# Patient Record
Sex: Female | Born: 1955
Health system: Southern US, Community
[De-identification: ages and names within clinical notes are randomized; demographics above are authoritative.]

## PROBLEM LIST (undated history)

## (undated) DIAGNOSIS — E119 Type 2 diabetes mellitus without complications: Secondary | ICD-10-CM

## (undated) DIAGNOSIS — K6819 Other retroperitoneal abscess: Secondary | ICD-10-CM

## (undated) DIAGNOSIS — D219 Benign neoplasm of connective and other soft tissue, unspecified: Secondary | ICD-10-CM

## (undated) HISTORY — PX: TUBAL LIGATION: SHX77

## (undated) HISTORY — PX: CYST EXCISION: SHX5701

## (undated) HISTORY — DX: Other retroperitoneal abscess: K68.19

## (undated) HISTORY — PX: BREAST BIOPSY: SHX20

## (undated) HISTORY — DX: Benign neoplasm of connective and other soft tissue, unspecified: D21.9

---

## 2005-12-09 ENCOUNTER — Emergency Department (HOSPITAL_COMMUNITY): Admission: EM | Admit: 2005-12-09 | Discharge: 2005-12-09 | Payer: Self-pay | Admitting: Emergency Medicine

## 2005-12-25 ENCOUNTER — Encounter: Admission: RE | Admit: 2005-12-25 | Discharge: 2005-12-25 | Payer: Self-pay | Admitting: General Practice

## 2005-12-29 ENCOUNTER — Encounter: Admission: RE | Admit: 2005-12-29 | Discharge: 2006-02-27 | Payer: Self-pay | Admitting: General Practice

## 2006-05-22 ENCOUNTER — Other Ambulatory Visit: Admission: RE | Admit: 2006-05-22 | Discharge: 2006-05-22 | Payer: Self-pay | Admitting: Obstetrics and Gynecology

## 2006-06-17 ENCOUNTER — Encounter: Admission: RE | Admit: 2006-06-17 | Discharge: 2006-06-17 | Payer: Self-pay | Admitting: Obstetrics and Gynecology

## 2007-07-15 ENCOUNTER — Encounter: Admission: RE | Admit: 2007-07-15 | Discharge: 2007-07-15 | Payer: Self-pay | Admitting: Obstetrics and Gynecology

## 2007-08-05 ENCOUNTER — Encounter: Admission: RE | Admit: 2007-08-05 | Discharge: 2007-08-05 | Payer: Self-pay | Admitting: General Practice

## 2008-07-17 ENCOUNTER — Encounter: Admission: RE | Admit: 2008-07-17 | Discharge: 2008-07-17 | Payer: Self-pay | Admitting: Obstetrics and Gynecology

## 2008-12-26 ENCOUNTER — Encounter: Admission: RE | Admit: 2008-12-26 | Discharge: 2009-01-26 | Payer: Self-pay | Admitting: Orthopedic Surgery

## 2009-07-24 ENCOUNTER — Encounter: Admission: RE | Admit: 2009-07-24 | Discharge: 2009-07-24 | Payer: Self-pay | Admitting: Obstetrics and Gynecology

## 2009-08-01 ENCOUNTER — Encounter: Admission: RE | Admit: 2009-08-01 | Discharge: 2009-08-01 | Payer: Self-pay | Admitting: Obstetrics and Gynecology

## 2010-05-10 ENCOUNTER — Encounter: Admission: RE | Admit: 2010-05-10 | Discharge: 2010-05-10 | Payer: Self-pay | Admitting: Obstetrics and Gynecology

## 2010-06-06 ENCOUNTER — Emergency Department (HOSPITAL_COMMUNITY): Admission: EM | Admit: 2010-06-06 | Discharge: 2010-06-06 | Payer: Self-pay | Admitting: Emergency Medicine

## 2010-09-03 ENCOUNTER — Encounter: Admission: RE | Admit: 2010-09-03 | Discharge: 2010-09-03 | Payer: Self-pay | Admitting: General Practice

## 2010-09-24 ENCOUNTER — Encounter
Admission: RE | Admit: 2010-09-24 | Discharge: 2010-10-31 | Payer: Self-pay | Source: Home / Self Care | Admitting: Unknown Physician Specialty

## 2011-06-20 ENCOUNTER — Other Ambulatory Visit: Payer: Self-pay | Admitting: Obstetrics and Gynecology

## 2011-06-20 DIAGNOSIS — Z1231 Encounter for screening mammogram for malignant neoplasm of breast: Secondary | ICD-10-CM

## 2011-06-24 ENCOUNTER — Ambulatory Visit
Admission: RE | Admit: 2011-06-24 | Discharge: 2011-06-24 | Disposition: A | Discharge status: Home / Self Care | Source: Ambulatory Visit | Attending: Obstetrics and Gynecology | Admitting: Obstetrics and Gynecology

## 2011-06-24 DIAGNOSIS — Z1231 Encounter for screening mammogram for malignant neoplasm of breast: Secondary | ICD-10-CM

## 2012-07-26 ENCOUNTER — Other Ambulatory Visit: Payer: Self-pay | Admitting: Obstetrics and Gynecology

## 2012-07-26 DIAGNOSIS — Z1231 Encounter for screening mammogram for malignant neoplasm of breast: Secondary | ICD-10-CM

## 2012-08-03 ENCOUNTER — Ambulatory Visit

## 2012-08-09 ENCOUNTER — Ambulatory Visit

## 2012-10-22 ENCOUNTER — Ambulatory Visit
Admission: RE | Admit: 2012-10-22 | Discharge: 2012-10-22 | Disposition: A | Discharge status: Home / Self Care | Source: Ambulatory Visit | Attending: Obstetrics and Gynecology | Admitting: Obstetrics and Gynecology

## 2012-10-22 DIAGNOSIS — Z1231 Encounter for screening mammogram for malignant neoplasm of breast: Secondary | ICD-10-CM

## 2012-11-03 ENCOUNTER — Ambulatory Visit (INDEPENDENT_AMBULATORY_CARE_PROVIDER_SITE_OTHER): Payer: Medicaid Other | Admitting: Obstetrics and Gynecology

## 2012-11-03 ENCOUNTER — Encounter: Payer: Self-pay | Admitting: Obstetrics and Gynecology

## 2012-11-03 VITALS — BP 102/70 | HR 68 | Ht 64.0 in | Wt 143.0 lb

## 2012-11-03 DIAGNOSIS — Z124 Encounter for screening for malignant neoplasm of cervix: Secondary | ICD-10-CM

## 2012-11-03 DIAGNOSIS — Z01419 Encounter for gynecological examination (general) (routine) without abnormal findings: Secondary | ICD-10-CM

## 2012-11-03 NOTE — Patient Instructions (Addendum)
Consider the following to regulate bowel movements:  Stool Softeners (Docusate Sodium) 100 mg 3-4 daily until bowel movements are regular, decrease until desired frequency is reached OR Benefiber as directed daily if able to drink liquids regularly throughout the day OR Miralax as directed for daily use along with Probiotics daily   Amitiza 8 mcg twice daily with food and 8 ounces of water

## 2012-11-03 NOTE — Progress Notes (Addendum)
Subjective:    Emma Hampton is a 57 y.o. female G3P0 who presents for annual exam. The patient has no complaints today except for problems with constipation which is chronic.  Stool is not particularly  hard and goes twice a day or may skip days,  but doesn't feel like she totally emptying her rectum.   Admits to gas and trapping of the same with discomfort.  Symptoms have worsened over the past several months.   Review of Systems Gastrointestinal:No change in bowel habits, no abdominal pain, no rectal bleeding Genitourinary:negative for abnormal vaginal bleeding,  dysuria, frequency, hematuria, nocturia and urinary incontinence   Objective:     BP 102/70  Pulse 68  Ht 5\' 4"  (1.626 m)  Wt 143 lb (64.864 kg)  BMI 24.55 kg/m2 Weight:  Wt Readings from Last 1 Encounters:  11/03/12 143 lb (64.864 kg)   Body mass index is 24.55 kg/(m^2). General Appearance:  Well nourished in no acute distress HEENT: Grossly normal Neck / Thyroid: Supple, no masses, nodes or enlargement Lungs: Clear to auscultation bilaterally Back: No CVA tenderness Breast Exam: No masses or nodes.No dimpling, nipple retraction or discharge. Cardiovascular: Regular rate and rhythm.  Gastrointestinal: Soft, non-tender, no masses or organomegaly Pelvic Exam: EGBUS-normal, vagina-atrophic mucosa without lesions, cervix-stenotic and flush with vagina, no tenderness or lesions, uterus-appears normal size, shape and consistency, adnexae-no masses or tenderness Rectovaginal: Normal sphincter tone and  no masses  Lymphatic Exam: Non-palpable nodes in neck, clavicular, axillary, or inguinal regions Skin: No rash or abnormalities Extremities: no clubbing cyanosis or edema Neurologic: Grossly normal Psychiatric: Alert and oriented x 3   Assessment:   Routine GYN Exam Constipation (chronic vs IBS)   Plan:  Bowel Hygiene  Trial of Amitiza 8 mcg #12 bid pc with 8 ounces of water-patient to let office know  if she  wants a prescription for this  PAP sent  RTO 1 year or prn  Kaitlyn Franko,ELMIRAPA-C

## 2012-11-03 NOTE — Progress Notes (Signed)
Regular Periods: no Mammogram: yes  Monthly Breast Ex.: yes Exercise: yes  Tetanus < 10 years: yes Seatbelts: yes  NI. Bladder Functn.: yes Abuse at home: no  Daily BM's: yes Stressful Work: no  Healthy Diet: yes Sigmoid-Colonoscopy: AGE 56  Calcium: no Medical problems this year: PT WANT SOMETHING FOR CONSTIPATION   LAST PAP:8/12  Contraception: NONE POST MENOPAUSEL  Mammogram:  2 WEEKS AGO   NL  PCP: DR. Thurmond Butts  PMH: NO CHANGE  FMH: NO CHANGE  Last Bone Scan: NO   PT IS WIDOW.

## 2012-11-04 LAB — PAP IG W/ RFLX HPV ASCU

## 2013-09-09 ENCOUNTER — Encounter (HOSPITAL_BASED_OUTPATIENT_CLINIC_OR_DEPARTMENT_OTHER): Payer: Self-pay | Admitting: *Deleted

## 2013-09-09 ENCOUNTER — Observation Stay (HOSPITAL_BASED_OUTPATIENT_CLINIC_OR_DEPARTMENT_OTHER)
Admission: EM | Admit: 2013-09-09 | Discharge: 2013-09-11 | Disposition: A | Discharge status: Home / Self Care | Attending: Family Medicine | Admitting: Family Medicine

## 2013-09-09 ENCOUNTER — Emergency Department (HOSPITAL_BASED_OUTPATIENT_CLINIC_OR_DEPARTMENT_OTHER)

## 2013-09-09 DIAGNOSIS — R509 Fever, unspecified: Principal | ICD-10-CM | POA: Insufficient documentation

## 2013-09-09 DIAGNOSIS — G039 Meningitis, unspecified: Secondary | ICD-10-CM

## 2013-09-09 DIAGNOSIS — A879 Viral meningitis, unspecified: Secondary | ICD-10-CM

## 2013-09-09 LAB — CSF CELL COUNT WITH DIFFERENTIAL
Other Cells, CSF: 0
Segmented Neutrophils-CSF: 0 % (ref 0–6)
WBC, CSF: 120 /mm3 (ref 0–5)

## 2013-09-09 LAB — CBC WITH DIFFERENTIAL/PLATELET
Basophils Absolute: 0 10*3/uL (ref 0.0–0.1)
Eosinophils Relative: 0 % (ref 0–5)
Hemoglobin: 14.2 g/dL (ref 12.0–15.0)
MCHC: 33.6 g/dL (ref 30.0–36.0)
MCV: 93.4 fL (ref 78.0–100.0)
Monocytes Absolute: 0.7 10*3/uL (ref 0.1–1.0)
Monocytes Relative: 10 % (ref 3–12)
Neutro Abs: 4.8 10*3/uL (ref 1.7–7.7)
Platelets: 234 10*3/uL (ref 150–400)
RBC: 4.52 MIL/uL (ref 3.87–5.11)
RDW: 13.7 % (ref 11.5–15.5)

## 2013-09-09 LAB — BASIC METABOLIC PANEL
BUN: 12 mg/dL (ref 6–23)
Creatinine, Ser: 0.8 mg/dL (ref 0.50–1.10)
Potassium: 3.6 mEq/L (ref 3.5–5.1)
Sodium: 137 mEq/L (ref 135–145)

## 2013-09-09 LAB — PROTEIN, CSF: Total  Protein, CSF: 113 mg/dL — ABNORMAL HIGH (ref 15–45)

## 2013-09-09 MED ORDER — METOCLOPRAMIDE HCL 5 MG/ML IJ SOLN
10.0000 mg | Freq: Once | INTRAMUSCULAR | Status: AC
  Administered 2013-09-09: 10 mg via INTRAVENOUS
  Filled 2013-09-09: qty 2

## 2013-09-09 MED ORDER — AMPICILLIN SODIUM 2 G IJ SOLR: 2.0000 g | Freq: Once | INTRAMUSCULAR | Status: DC

## 2013-09-09 MED ORDER — ONDANSETRON HCL 4 MG/2ML IJ SOLN
4.0000 mg | Freq: Once | INTRAMUSCULAR | Status: AC
  Administered 2013-09-09: 4 mg via INTRAVENOUS
  Filled 2013-09-09: qty 2

## 2013-09-09 MED ORDER — SODIUM CHLORIDE 0.9 % IV BOLUS (SEPSIS)
1000.0000 mL | Freq: Once | INTRAVENOUS | Status: AC
  Administered 2013-09-09: 1000 mL via INTRAVENOUS

## 2013-09-09 MED ORDER — VANCOMYCIN HCL IN DEXTROSE 1-5 GM/200ML-% IV SOLN
1000.0000 mg | Freq: Once | INTRAVENOUS | Status: AC
  Administered 2013-09-09: 1000 mg via INTRAVENOUS
  Filled 2013-09-09: qty 200

## 2013-09-09 MED ORDER — SODIUM CHLORIDE 0.9 % IV SOLN
INTRAVENOUS | Status: AC
  Administered 2013-09-09: 21:00:00 via INTRAVENOUS

## 2013-09-09 MED ORDER — DEXTROSE 5 % IV SOLN
2.0000 g | Freq: Once | INTRAVENOUS | Status: AC
  Administered 2013-09-09: 2 g via INTRAVENOUS
  Filled 2013-09-09: qty 2

## 2013-09-09 MED ORDER — DEXAMETHASONE SODIUM PHOSPHATE 10 MG/ML IJ SOLN
10.0000 mg | Freq: Once | INTRAMUSCULAR | Status: AC
  Administered 2013-09-09: 10 mg via INTRAVENOUS
  Filled 2013-09-09: qty 1

## 2013-09-09 MED ORDER — MORPHINE SULFATE 4 MG/ML IJ SOLN
4.0000 mg | Freq: Once | INTRAMUSCULAR | Status: AC
  Administered 2013-09-09: 4 mg via INTRAVENOUS
  Filled 2013-09-09: qty 1

## 2013-09-09 MED ORDER — DIPHENHYDRAMINE HCL 50 MG/ML IJ SOLN
25.0000 mg | Freq: Once | INTRAMUSCULAR | Status: AC
  Administered 2013-09-09: 25 mg via INTRAVENOUS
  Filled 2013-09-09: qty 1

## 2013-09-09 NOTE — ED Notes (Signed)
Report to Shane, RN

## 2013-09-09 NOTE — ED Provider Notes (Signed)
CSN: [id number]     Arrival date & time 09/09/13  1606 History   First MD Initiated Contact with Patient 09/09/13 1615     Chief Complaint  Patient presents with  . Headache   (Consider location/radiation/quality/duration/timing/severity/associated sxs/prior Treatment) HPI Comments: Patient comes to the ER for evaluation of headache. Patient reports a severe right-sided headache for 3 days. The patient reports that the headache came on somewhat suddenly 3 days ago and has been persistent. She says that she noticed pain in her right ear prior to onset of a headache, but the urinary cath resolved. She has not had any nausea, vomiting, diarrhea. She denies neck pain and stiffness, but has had a low-grade temperature of 100.6.  Patient is a 57 y.o. female presenting with headaches.  Headache Associated symptoms: fever   Associated symptoms: no neck pain and no neck stiffness     Past Medical History  Diagnosis Date  . Fibroids   . Retroperitoneal abscess    Past Surgical History  Procedure Laterality Date  . Cesarean section    . Cyst excision    . Tubal ligation     Family History  Problem Relation Age of Onset  . Heart disease Mother     MI  . Cancer Daughter     BREAST   LEFT MASECT  . Breast cancer Daughter   . Diabetes Maternal Uncle   . Cancer Cousin     IN LEGS   History  Substance Use Topics  . Smoking status: Never Smoker   . Smokeless tobacco: Never Used  . Alcohol Use: 0.6 oz/week    1 Glasses of wine per week   OB History   Grav Para Term Preterm Abortions TAB SAB Ect Mult Living   3         2     Review of Systems  Constitutional: Positive for fever.  HENT: Negative for neck pain and neck stiffness.   Neurological: Positive for headaches.  All other systems reviewed and are negative.    Allergies  Codeine  Home Medications   Current Outpatient Rx  Name  Route  Sig  Dispense  Refill  . Multiple Vitamin (MULTIVITAMIN) tablet   Oral   Take 1  tablet by mouth daily.         . Nutritional Supplements (ESTROVEN PO)   Oral   Take by mouth.          BP 136/82  Pulse 73  Temp(Src) 98.4 F (36.9 C) (Oral)  Resp 16  Ht 5\' 4"  (1.626 m)  Wt 137 lb (62.143 kg)  BMI 23.5 kg/m2  SpO2 100% Physical Exam  Constitutional: She is oriented to person, place, and time. She appears well-developed and well-nourished. No distress.  HENT:  Head: Normocephalic and atraumatic.  Right Ear: Hearing, tympanic membrane and ear canal normal.  Left Ear: Hearing normal.  Nose: Nose normal.  Mouth/Throat: Oropharynx is clear and moist and mucous membranes are normal.  Eyes: Conjunctivae and EOM are normal. Pupils are equal, round, and reactive to light.  Neck: Normal range of motion. Neck supple.  Cardiovascular: Regular rhythm, S1 normal and S2 normal.  Exam reveals no gallop and no friction rub.   No murmur heard. Pulmonary/Chest: Effort normal and breath sounds normal. No respiratory distress. She exhibits no tenderness.  Abdominal: Soft. Normal appearance and bowel sounds are normal. There is no hepatosplenomegaly. There is no tenderness. There is no rebound, no guarding, no tenderness at McBurney's point  and negative Murphy's sign. No hernia.  Musculoskeletal: Normal range of motion.  Neurological: She is alert and oriented to person, place, and time. She has normal strength. No cranial nerve deficit or sensory deficit. Coordination normal. GCS eye subscore is 4. GCS verbal subscore is 5. GCS motor subscore is 6.  Skin: Skin is warm, dry and intact. No rash noted. No cyanosis.  Psychiatric: She has a normal mood and affect. Her speech is normal and behavior is normal. Thought content normal.    ED Course  LUMBAR PUNCTURE Date/Time: 09/09/2013 6:59 PM Performed by: Gilda Crease. Authorized by: Gilda Crease Consent: Verbal consent obtained. written consent obtained. Risks and benefits: risks, benefits and alternatives  were discussed Consent given by: patient Patient understanding: patient states understanding of the procedure being performed Patient consent: the patient's understanding of the procedure matches consent given Procedure consent: procedure consent matches procedure scheduled Relevant documents: relevant documents present and verified Test results: test results available and properly labeled Site marked: the operative site was marked Imaging studies: imaging studies available Patient identity confirmed: arm band and verbally with patient Time out: Immediately prior to procedure a "time out" was called to verify the correct patient, procedure, equipment, support staff and site/side marked as required. Indications: evaluation for infection and evaluation for subarachnoid hemorrhage Anesthesia: local infiltration Local anesthetic: lidocaine 1% without epinephrine Patient sedated: no Preparation: Patient was prepped and draped in the usual sterile fashion. Lumbar space: L3-L4 interspace Patient's position: sitting Needle gauge: 20 Needle type: spinal needle - Quincke tip Needle length: 3.5 in Number of attempts: 2 Opening pressure: 30.5 cm H2O Fluid appearance: clear Tubes of fluid: 4 Total volume: 4 ml Post-procedure: site cleaned and adhesive bandage applied Patient tolerance: Patient tolerated the procedure well with no immediate complications.   (including critical care time) Labs Review Labs Reviewed  BASIC METABOLIC PANEL - Abnormal; Notable for the following:    GFR calc non Af Amer 79 (*)    All other components within normal limits  PROTEIN, CSF - Abnormal; Notable for the following:    Total  Protein, CSF 113 (*)    All other components within normal limits  CSF CULTURE  CBC WITH DIFFERENTIAL  GLUCOSE, CSF  CSF CELL COUNT WITH DIFFERENTIAL  CSF CELL COUNT WITH DIFFERENTIAL   Imaging Review Ct Head Wo Contrast  09/09/2013   CLINICAL DATA:  Severe headache  EXAM: CT  HEAD WITHOUT CONTRAST  TECHNIQUE: Contiguous axial images were obtained from the base of the skull through the vertex without intravenous contrast.  COMPARISON:  06/06/2010  FINDINGS: No midline shift, ventriculomegaly, mass effect, evidence of mass lesion, intracranial hemorrhage or evidence of cortically based acute infarction. Gray-white matter differentiation is within normal limits throughout the brain. The paranasal sinuses and mastoid air cells are clear. The skull is intact.  IMPRESSION: Normal brain.   Electronically Signed   By: Signa Kell M.D.   On: 09/09/2013 17:20    MDM  Diagnosis: 1. Headache 2. Fever 3. Rule out meningitis  Patient presents to the ER for evaluation of severe headache that started 3 days ago and has been continuous. Patient reports that she does not usually have headaches like this. She has unremarkable neurologic findings on examination.  Based on the fact that the headache is unusual for her, recommended CT. Additionally, patient describes fever at home, which raises concern for meningitis. He recommended pursuing a lumbar puncture as well. After discussing these procedures, patient did consent to  the treatment plan.  Patient's blood work was unremarkable as was the CT. Lumbar puncture was performed and although she has normal glucose, has elevated protein. This is likely a viral meningitis type picture, but Gram stain and cell count and differential not available yet. Patient initiated on empiric antibiotic coverage and will be admitted for further management.    Gilda Crease, MD 09/09/13 2025

## 2013-09-09 NOTE — Progress Notes (Signed)
PENDING ACCEPTANCE TRANFER NOTE:  Call received from: Jaci Carrel of [facility].  REASON FOR REQUESTING TRANSFER: ?viral meningitis.  HPI: 57 yo with severe HA, photophobia, but no stiff neck and no confusion, low grad fever of 100, for the past 3 days. LP showed glucose normal glucose and Protein of 113, head CT negative.  Cell count and gramstain are pending. She was started on Amp/Van/Rocephin and he would like her transferred here for follow up LP result and further treatment.   PLAN:  According to telephone report, this patient was accepted for transfer to general medical floor,   Under [facility] team:  10,  I have requested an order be written to call Flow Manager at [phone] upon patient arrival to the floor for final physician assignment who will do the admission and give admitting orders.  SIGNEDHouston Siren, MD [facility]  09/09/2013, 9:01 PM

## 2013-09-09 NOTE — ED Notes (Signed)
Pt c/o " severe h/a " x 3 days

## 2013-09-10 ENCOUNTER — Encounter (HOSPITAL_COMMUNITY): Payer: Self-pay

## 2013-09-10 DIAGNOSIS — A879 Viral meningitis, unspecified: Secondary | ICD-10-CM

## 2013-09-10 MED ORDER — ONDANSETRON HCL 4 MG/2ML IJ SOLN
4.0000 mg | Freq: Four times a day (QID) | INTRAMUSCULAR | Status: DC | PRN
  Administered 2013-09-10: 4 mg via INTRAVENOUS
  Filled 2013-09-10: qty 2

## 2013-09-10 MED ORDER — DEXTROSE-NACL 5-0.9 % IV SOLN
INTRAVENOUS | Status: DC
  Administered 2013-09-10 (×2): via INTRAVENOUS

## 2013-09-10 MED ORDER — ACETAMINOPHEN 325 MG PO TABS
650.0000 mg | ORAL_TABLET | Freq: Four times a day (QID) | ORAL | Status: DC | PRN
  Administered 2013-09-10 – 2013-09-11 (×2): 650 mg via ORAL
  Filled 2013-09-10 (×2): qty 2

## 2013-09-10 MED ORDER — INFLUENZA VAC SPLIT QUAD 0.5 ML IM SUSP
0.5000 mL | INTRAMUSCULAR | Status: AC
  Administered 2013-09-11: 0.5 mL via INTRAMUSCULAR
  Filled 2013-09-10: qty 0.5

## 2013-09-10 MED ORDER — ONDANSETRON HCL 4 MG PO TABS: 4.0000 mg | ORAL_TABLET | Freq: Four times a day (QID) | ORAL | Status: DC | PRN

## 2013-09-10 MED ORDER — HYDROMORPHONE HCL PF 1 MG/ML IJ SOLN: 0.5000 mg | INTRAMUSCULAR | Status: DC | PRN

## 2013-09-10 MED ORDER — HYDROMORPHONE HCL PF 1 MG/ML IJ SOLN
1.0000 mg | INTRAMUSCULAR | Status: DC | PRN
  Administered 2013-09-10: 1 mg via INTRAVENOUS
  Filled 2013-09-10 (×2): qty 1

## 2013-09-10 MED ORDER — DOCUSATE SODIUM 100 MG PO CAPS
100.0000 mg | ORAL_CAPSULE | Freq: Two times a day (BID) | ORAL | Status: DC
  Administered 2013-09-10 – 2013-09-11 (×3): 100 mg via ORAL
  Filled 2013-09-10 (×2): qty 1

## 2013-09-10 NOTE — Progress Notes (Signed)
Pt was seen and examined.  Still having headache and some back pain from LP.  Currently afebrile.   Cultures no growth.  Likely DC soon if remains stable.  Maryln Manuel, MD

## 2013-09-10 NOTE — H&P (Signed)
[facility] History and Physical  Hendrix Console AVW:[id number] DOB: Oct 14, 1956    PCP:   Erlinda Hong, MD   Chief Complaint: Headache for three days.  HPI: Emma Hampton is an 57 y.o. female with benign PMH on no chronic meds presents to [facility] with HA.  She has severe HA for three days with mild photophobia.  She denied any stiff neck, blurry Vx, but had low grade fever.  She has had no distant travel or any ill contacts.  Evalation in the ER showed negative head CT.  LP showed normal glucose, but elevated protein to 113.  Her LP showed 120 WBC and 180 RBC with 97 percent lymphocytic and 3 percent monocytic.  Gram stain showed no organism.  She was given IV Van/ Decadron/Rocephin/ Ampicillin IV at [facility], and was transferred here.  She is not confused at all, and her HA is controlled at this point.  Rewiew of Systems:  Constitutional: Negative for malaise, fever and chills. No significant weight loss or weight gain Eyes: Negative for eye pain, redness and discharge, diplopia, visual changes, or flashes of light. ENMT: Negative for ear pain, hoarseness, nasal congestion, sinus pressure and sore throat. No headaches; tinnitus, drooling, or problem swallowing. Cardiovascular: Negative for chest pain, palpitations, diaphoresis, dyspnea and peripheral edema. ; No orthopnea, PND Respiratory: Negative for cough, hemoptysis, wheezing and stridor. No pleuritic chestpain. Gastrointestinal: Negative for nausea, vomiting, diarrhea, constipation, abdominal pain, melena, blood in stool, hematemesis, jaundice and rectal bleeding.    Genitourinary: Negative for frequency, dysuria, incontinence,flank pain and hematuria; Musculoskeletal: Negative for back pain and neck pain. Negative for swelling and trauma.;  Skin: . Negative for pruritus, rash, abrasions, bruising and skin lesion.; ulcerations Neuro: Negative for lightheadedness and neck stiffness. Negative for weakness, altered level of  consciousness , altered mental status, extremity weakness, burning feet, involuntary movement, seizure and syncope.  Psych: negative for anxiety, depression, insomnia, tearfulness, panic attacks, hallucinations, paranoia, suicidal or homicidal ideation    Past Medical History  Diagnosis Date  . Fibroids   . Retroperitoneal abscess     Past Surgical History  Procedure Laterality Date  . Cesarean section    . Cyst excision    . Tubal ligation      Medications:  HOME MEDS: Prior to Admission medications   Medication Sig Start Date End Date Taking? Authorizing Provider  Multiple Vitamin (MULTIVITAMIN) tablet Take 1 tablet by mouth daily.    Historical Provider, MD  Nutritional Supplements (ESTROVEN PO) Take by mouth.    Historical Provider, MD     Allergies:  Allergies  Allergen Reactions  . Codeine     Social History:   reports that she has never smoked. She has never used smokeless tobacco. She reports that she drinks about 0.6 ounces of alcohol per week. She reports that she does not use illicit drugs.  Family History: Family History  Problem Relation Age of Onset  . Heart disease Mother     MI  . Cancer Daughter     BREAST   LEFT MASECT  . Breast cancer Daughter   . Diabetes Maternal Uncle   . Cancer Cousin     IN LEGS     Physical Exam: Filed Vitals:   09/09/13 1614 09/09/13 1740 09/09/13 2329 09/10/13 0107  BP:  148/72 127/74 154/83  Pulse: 73 72 62 67  Temp:  99.3 F (37.4 C) 98.1 F (36.7 C) 98.6 F (37 C)  TempSrc:  Oral Oral Oral  Resp:  16 17  Height:      Weight:    63.05 kg (139 lb)  SpO2: 100% 99% 99% 100%   Blood pressure 154/83, pulse 67, temperature 98.6 F (37 C), temperature source Oral, resp. rate 17, height 5\' 4"  (1.626 m), weight 63.05 kg (139 lb), SpO2 100.00%.  GEN:  Pleasant  patient lying in the stretcher in no acute distress; cooperative with exam. PSYCH:  alert and oriented x4; does not appear anxious or depressed; affect  is appropriate. HEENT: Mucous membranes pink and anicteric; PERRLA; EOM intact; no cervical lymphadenopathy nor thyromegaly or carotid bruit; no JVD; There were no stridor. Neck is very supple. Breasts:: Not examined CHEST WALL: No tenderness CHEST: Normal respiration, clear to auscultation bilaterally.  HEART: Regular rate and rhythm.  There are no murmur, rub, or gallops.   BACK: No kyphosis or scoliosis; no CVA tenderness ABDOMEN: soft and non-tender; no masses, no organomegaly, normal abdominal bowel sounds; no pannus; no intertriginous candida. There is no rebound and no distention. Rectal Exam: Not done EXTREMITIES: No bone or joint deformity; age-appropriate arthropathy of the hands and knees; no edema; no ulcerations.  There is no calf tenderness. Genitalia: not examined PULSES: 2+ and symmetric SKIN: Normal hydration no rash or ulceration CNS: Cranial nerves 2-12 grossly intact no focal lateralizing neurologic deficit.  Speech is fluent; uvula elevated with phonation, facial symmetry and tongue midline. DTR are normal bilaterally, cerebella exam is intact, barbinski is negative and strengths are equaled bilaterally.  No sensory loss.   Labs on Admission:  Basic Metabolic Panel:  Recent Labs Lab 09/09/13 1645  NA 137  K 3.6  CL 97  CO2 28  GLUCOSE 96  BUN 12  CREATININE 0.80  CALCIUM 10.3   Liver Function Tests: No results found for this basename: AST, ALT, ALKPHOS, BILITOT, PROT, ALBUMIN,  in the last 168 hours No results found for this basename: LIPASE, AMYLASE,  in the last 168 hours No results found for this basename: AMMONIA,  in the last 168 hours CBC:  Recent Labs Lab 09/09/13 1645  WBC 7.2  NEUTROABS 4.8  HGB 14.2  HCT 42.2  MCV 93.4  PLT 234   Cardiac Enzymes: No results found for this basename: CKTOTAL, CKMB, CKMBINDEX, TROPONINI,  in the last 168 hours  CBG: No results found for this basename: GLUCAP,  in the last 168 hours   Radiological Exams  on Admission: Ct Head Wo Contrast  09/09/2013   CLINICAL DATA:  Severe headache  EXAM: CT HEAD WITHOUT CONTRAST  TECHNIQUE: Contiguous axial images were obtained from the base of the skull through the vertex without intravenous contrast.  COMPARISON:  06/06/2010  FINDINGS: No midline shift, ventriculomegaly, mass effect, evidence of mass lesion, intracranial hemorrhage or evidence of cortically based acute infarction. Gray-white matter differentiation is within normal limits throughout the brain. The paranasal sinuses and mastoid air cells are clear. The skull is intact.  IMPRESSION: Normal brain.   Electronically Signed   By: Signa Kell M.D.   On: 09/09/2013 17:20    Assessment/Plan Viral meningitis.  PLAN: Her LP and clinical picture is most consistent with viral meningitis.  Will d/c all her antibiotics.  Will treat her symptomatically.  She will get IVF and stay flat to avoid post LP HA.  She is stable and will likely be discharged later today.  No need for droplet precaution at this time.  Thank you for asking me to admit this nice patient.  Other plans as  per orders.  Code Status: FULL Unk Lightning, MD. [facility] Pager [phone] 7pm to 7am.  09/10/2013, 2:10 AM

## 2013-09-11 DIAGNOSIS — A879 Viral meningitis, unspecified: Secondary | ICD-10-CM

## 2013-09-11 NOTE — Progress Notes (Signed)
Utilization Review completed.  

## 2013-09-11 NOTE — Discharge Summary (Signed)
Physician Discharge Summary  Emma Hampton ZOX:[id number] DOB: Dec 10, 1956 DOA: 09/09/2013  PCP: Erlinda Hong, MD  Admit date: 09/09/2013 Discharge date: 09/11/2013  Recommendations for Outpatient Follow-up:  1. Follow up on final CSF and blood culture results 2. Check blood pressure   Discharge Diagnoses:  Principal Problem:   Meningitis, viral  Discharge Condition: stable    Diet recommendation: regular  Filed Weights   09/09/13 1610 09/10/13 0107  Weight: 62.143 kg (137 lb) 63.05 kg (139 lb)    History of present illness:  Emma Hampton is an 57 y.o. female with benign PMH on no chronic meds presents to [facility] with HA. She has severe HA for three days with mild photophobia. She denied any stiff neck, no vision changes, but had low grade fever. She has had no distant travel or any ill contacts. Evalation in the ER showed negative head CT. LP showed normal glucose, but elevated protein to 113. Her LP showed 120 WBC and 180 RBC with 97 percent lymphocytic and 3 percent monocytic. Gram stain showed no organism. She was given IV Van/ Decadron/Rocephin/ Ampicillin IV at [facility], and was transferred here. She is not confused at all, and her HA is controlled at this point.  Hospital Course:  Viral meningitis.   Her LP and clinical picture is most consistent with viral meningitis. d/c all her antibiotics. Treat her symptomatically. She will get IVF and stay flat to avoid post LP HA. She remained stable and  discharged home to follow up with PCP.  CSF and blood cultures have been negative to date.   No need for droplet precaution.  Procedures:  Lumbar Puncture  Discharge Exam: Pt reports that she feels much better.  She is no longer having headache or back pain.  She has remained afebrile.  She is ambulating well.  No complaints and asking to go home.  Filed Vitals:   09/11/13 0907  BP: 116/58  Pulse: 52  Temp: 98.2 F (36.8 C)  Resp: 20   General: awake, alert, no  apparent distress Cardiovascular: normal s1, s2 sounds  Respiratory: BBS clear to ausculation Ext- no CCE Neuro- nonfocal   Discharge Instructions  Discharge Orders   Future Orders Complete By Expires   Call MD for:  severe uncontrolled pain  As directed    Discharge instructions  As directed    Comments:     See your PCP in next 3 days to follow up on blood pressure and final culture results Return if symptoms recur, worsen or new problems develop.   Discontinue IV  As directed    Increase activity slowly  As directed        Medication List         acetaminophen 500 MG tablet  Commonly known as:  TYLENOL  Take 1,000 mg by mouth every 6 (six) hours as needed for pain or fever.     ESTROVEN PO  Take 1 tablet by mouth daily.     ibuprofen 200 MG tablet  Commonly known as:  ADVIL,MOTRIN  Take 200-600 mg by mouth every 6 (six) hours as needed for pain or headache.     multivitamin tablet  Take 1 tablet by mouth daily.       Allergies  Allergen Reactions  . Codeine Nausea And Vomiting       Follow-up Information   Follow up with [facility], [place]. Schedule an appointment as soon as possible for a visit in 3 days. (hospital followup)    Specialty:  General Practice   Contact information:   [address] [place] [address] [phone]      The results of significant diagnostics from this hospitalization (including imaging, microbiology, ancillary and laboratory) are listed below for reference.    Significant Diagnostic Studies: Ct Head Wo Contrast  09/09/2013   CLINICAL DATA:  Severe headache  EXAM: CT HEAD WITHOUT CONTRAST  TECHNIQUE: Contiguous axial images were obtained from the base of the skull through the vertex without intravenous contrast.  COMPARISON:  06/06/2010  FINDINGS: No midline shift, ventriculomegaly, mass effect, evidence of mass lesion, intracranial hemorrhage or evidence of cortically based acute infarction. Gray-white matter  differentiation is within normal limits throughout the brain. The paranasal sinuses and mastoid air cells are clear. The skull is intact.  IMPRESSION: Normal brain.   Electronically Signed   By: Signa Kell M.D.   On: 09/09/2013 17:20   Microbiology: Recent Results (from the past 240 hour(s))  CSF CULTURE     Status: None   Collection Time    09/09/13  6:50 PM      Result Value Range Status   Specimen Description BACK   Final   Special Requests Normal   Final   Gram Stain     Final   Value: WBC PRESENT, PREDOMINANTLY MONONUCLEAR     NO ORGANISMS SEEN     CYTOSPIN     Performed at [facility]   Culture     Final   Value: NO GROWTH 2 DAYS     Performed at [facility]   Report Status PENDING   Incomplete    Labs: Basic Metabolic Panel:  Recent Labs Lab 09/09/13 1645  NA 137  K 3.6  CL 97  CO2 28  GLUCOSE 96  BUN 12  CREATININE 0.80  CALCIUM 10.3   Liver Function Tests: No results found for this basename: AST, ALT, ALKPHOS, BILITOT, PROT, ALBUMIN,  in the last 168 hours No results found for this basename: LIPASE, AMYLASE,  in the last 168 hours No results found for this basename: AMMONIA,  in the last 168 hours CBC:  Recent Labs Lab 09/09/13 1645  WBC 7.2  NEUTROABS 4.8  HGB 14.2  HCT 42.2  MCV 93.4  PLT 234   Cardiac Enzymes: No results found for this basename: CKTOTAL, CKMB, CKMBINDEX, TROPONINI,  in the last 168 hours BNP: BNP (last 3 results) No results found for this basename: PROBNP,  in the last 8760 hours CBG: No results found for this basename: GLUCAP,  in the last 168 hours  Signed:  Clanford Johnson  [facility] 09/11/2013, 10:00 AM

## 2013-09-12 LAB — PATHOLOGIST SMEAR REVIEW

## 2013-09-13 LAB — CSF CULTURE W GRAM STAIN

## 2013-09-16 LAB — CULTURE, BLOOD (ROUTINE X 2): Culture: NO GROWTH

## 2013-11-08 ENCOUNTER — Other Ambulatory Visit: Payer: Self-pay | Admitting: Obstetrics and Gynecology

## 2013-11-08 DIAGNOSIS — Z1231 Encounter for screening mammogram for malignant neoplasm of breast: Secondary | ICD-10-CM

## 2013-12-06 ENCOUNTER — Ambulatory Visit
Admission: RE | Admit: 2013-12-06 | Discharge: 2013-12-06 | Disposition: A | Discharge status: Home / Self Care | Source: Ambulatory Visit | Attending: Obstetrics and Gynecology | Admitting: Obstetrics and Gynecology

## 2013-12-06 DIAGNOSIS — Z1231 Encounter for screening mammogram for malignant neoplasm of breast: Secondary | ICD-10-CM

## 2014-02-15 ENCOUNTER — Encounter (HOSPITAL_COMMUNITY): Payer: Self-pay | Admitting: Emergency Medicine

## 2014-02-15 ENCOUNTER — Emergency Department (HOSPITAL_COMMUNITY)
Admission: EM | Admit: 2014-02-15 | Discharge: 2014-02-15 | Disposition: A | Discharge status: Home / Self Care | Payer: No Typology Code available for payment source | Attending: Emergency Medicine | Admitting: Emergency Medicine

## 2014-02-15 ENCOUNTER — Emergency Department (HOSPITAL_COMMUNITY): Payer: No Typology Code available for payment source

## 2014-02-15 DIAGNOSIS — Z8742 Personal history of other diseases of the female genital tract: Secondary | ICD-10-CM | POA: Insufficient documentation

## 2014-02-15 DIAGNOSIS — S6990XA Unspecified injury of unspecified wrist, hand and finger(s), initial encounter: Secondary | ICD-10-CM | POA: Insufficient documentation

## 2014-02-15 DIAGNOSIS — Z791 Long term (current) use of non-steroidal anti-inflammatories (NSAID): Secondary | ICD-10-CM | POA: Diagnosis not present

## 2014-02-15 DIAGNOSIS — M4180 Other forms of scoliosis, site unspecified: Secondary | ICD-10-CM

## 2014-02-15 DIAGNOSIS — S6980XA Other specified injuries of unspecified wrist, hand and finger(s), initial encounter: Secondary | ICD-10-CM | POA: Insufficient documentation

## 2014-02-15 DIAGNOSIS — IMO0002 Reserved for concepts with insufficient information to code with codable children: Secondary | ICD-10-CM | POA: Diagnosis present

## 2014-02-15 DIAGNOSIS — F17200 Nicotine dependence, unspecified, uncomplicated: Secondary | ICD-10-CM | POA: Insufficient documentation

## 2014-02-15 DIAGNOSIS — H409 Unspecified glaucoma: Secondary | ICD-10-CM | POA: Insufficient documentation

## 2014-02-15 DIAGNOSIS — Z79899 Other long term (current) drug therapy: Secondary | ICD-10-CM | POA: Diagnosis not present

## 2014-02-15 DIAGNOSIS — M4120 Other idiopathic scoliosis, site unspecified: Secondary | ICD-10-CM | POA: Insufficient documentation

## 2014-02-15 DIAGNOSIS — R51 Headache: Secondary | ICD-10-CM | POA: Diagnosis not present

## 2014-02-15 DIAGNOSIS — Y92410 Unspecified street and highway as the place of occurrence of the external cause: Secondary | ICD-10-CM | POA: Diagnosis not present

## 2014-02-15 DIAGNOSIS — S99919A Unspecified injury of unspecified ankle, initial encounter: Secondary | ICD-10-CM

## 2014-02-15 DIAGNOSIS — Z8719 Personal history of other diseases of the digestive system: Secondary | ICD-10-CM | POA: Insufficient documentation

## 2014-02-15 DIAGNOSIS — S8990XA Unspecified injury of unspecified lower leg, initial encounter: Secondary | ICD-10-CM | POA: Insufficient documentation

## 2014-02-15 DIAGNOSIS — S99929A Unspecified injury of unspecified foot, initial encounter: Secondary | ICD-10-CM

## 2014-02-15 DIAGNOSIS — Z792 Long term (current) use of antibiotics: Secondary | ICD-10-CM | POA: Diagnosis not present

## 2014-02-15 DIAGNOSIS — Y9389 Activity, other specified: Secondary | ICD-10-CM | POA: Insufficient documentation

## 2014-02-15 DIAGNOSIS — M545 Low back pain, unspecified: Secondary | ICD-10-CM

## 2014-02-15 MED ORDER — METHOCARBAMOL 500 MG PO TABS: 500.0000 mg | ORAL_TABLET | Freq: Two times a day (BID) | ORAL | Status: DC

## 2014-02-15 MED ORDER — KETOROLAC TROMETHAMINE 60 MG/2ML IM SOLN
60.0000 mg | Freq: Once | INTRAMUSCULAR | Status: AC
  Administered 2014-02-15: 60 mg via INTRAMUSCULAR
  Filled 2014-02-15: qty 2

## 2014-02-15 MED ORDER — NAPROXEN 500 MG PO TABS: 500.0000 mg | ORAL_TABLET | Freq: Two times a day (BID) | ORAL | Status: DC

## 2014-02-15 NOTE — Discharge Instructions (Signed)
Follow up with your doctor in 2 days. Take medications as directed. Return to ED if you develop any lower extremity weakness, numbness, or difficulty urinating.    Back Pain, Adult Back pain is very common. The pain often gets better over time. The cause of back pain is usually not dangerous. Most people can learn to manage their back pain on their own.  HOME CARE   Stay active. Start with short walks on flat ground if you can. Try to walk farther each day.  Do not sit, drive, or stand in one place for more than 30 minutes. Do not stay in bed.  Do not avoid exercise or work. Activity can help your back heal faster.  Be careful when you bend or lift an object. Bend at your knees, keep the object close to you, and do not twist.  Sleep on a firm mattress. Lie on your side, and bend your knees. If you lie on your back, put a pillow under your knees.  Only take medicines as told by your doctor.  Put ice on the injured area.  Put ice in a plastic bag.  Place a towel between your skin and the bag.  Leave the ice on for 15-20 minutes, 03-04 times a day for the first 2 to 3 days. After that, you can switch between ice and heat packs.  Ask your doctor about back exercises or massage.  Avoid feeling anxious or stressed. Find good ways to deal with stress, such as exercise. GET HELP RIGHT AWAY IF:   Your pain does not go away with rest or medicine.  Your pain does not go away in 1 week.  You have new problems.  You do not feel well.  The pain spreads into your legs.  You cannot control when you poop (bowel movement) or pee (urinate).  Your arms or legs feel weak or lose feeling (numbness).  You feel sick to your stomach (nauseous) or throw up (vomit).  You have belly (abdominal) pain.  You feel like you may pass out (faint). MAKE SURE YOU:   Understand these instructions.  Will watch your condition.  Will get help right away if you are not doing well or get  worse. Document Released: 05/26/2008 Document Revised: 03/01/2012 Document Reviewed: 04/28/2011 [facility] Patient Information 2014 [place].
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
## 2014-02-15 NOTE — ED Notes (Signed)
PA at bedside.

## 2014-02-15 NOTE — ED Notes (Signed)
Pt. Reports swelling of index fingers.
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
## 2014-02-18 NOTE — ED Provider Notes (Signed)
Medical screening examination/treatment/procedure(s) were performed by non-physician practitioner and as supervising physician I was immediately available for consultation/collaboration.  Cletis Clack, MD 02/18/14 0702 

## 2014-10-23 ENCOUNTER — Encounter (HOSPITAL_COMMUNITY): Payer: Self-pay | Admitting: Emergency Medicine

## 2014-12-08 ENCOUNTER — Other Ambulatory Visit: Payer: Self-pay | Admitting: Obstetrics and Gynecology

## 2014-12-08 DIAGNOSIS — Z780 Asymptomatic menopausal state: Secondary | ICD-10-CM

## 2014-12-08 DIAGNOSIS — Z1231 Encounter for screening mammogram for malignant neoplasm of breast: Secondary | ICD-10-CM

## 2015-01-05 ENCOUNTER — Ambulatory Visit
Admission: RE | Admit: 2015-01-05 | Discharge: 2015-01-05 | Disposition: A | Discharge status: Home / Self Care | Source: Ambulatory Visit | Attending: Obstetrics and Gynecology | Admitting: Obstetrics and Gynecology

## 2015-01-05 DIAGNOSIS — Z1231 Encounter for screening mammogram for malignant neoplasm of breast: Secondary | ICD-10-CM

## 2015-01-05 DIAGNOSIS — Z780 Asymptomatic menopausal state: Secondary | ICD-10-CM

## 2015-07-04 ENCOUNTER — Other Ambulatory Visit: Payer: Self-pay | Admitting: Internal Medicine

## 2015-07-04 DIAGNOSIS — R7989 Other specified abnormal findings of blood chemistry: Secondary | ICD-10-CM

## 2015-07-09 ENCOUNTER — Encounter (HOSPITAL_COMMUNITY): Admission: RE | Admit: 2015-07-09 | Source: Ambulatory Visit

## 2015-07-10 ENCOUNTER — Encounter (HOSPITAL_COMMUNITY)

## 2015-07-26 ENCOUNTER — Encounter (HOSPITAL_COMMUNITY)
Admission: RE | Admit: 2015-07-26 | Discharge: 2015-07-26 | Disposition: A | Discharge status: Home / Self Care | Source: Ambulatory Visit | Attending: Internal Medicine | Admitting: Internal Medicine

## 2015-07-26 DIAGNOSIS — R7989 Other specified abnormal findings of blood chemistry: Secondary | ICD-10-CM | POA: Insufficient documentation

## 2015-07-26 DIAGNOSIS — R5383 Other fatigue: Secondary | ICD-10-CM | POA: Insufficient documentation

## 2015-07-26 DIAGNOSIS — G4700 Insomnia, unspecified: Secondary | ICD-10-CM | POA: Insufficient documentation

## 2015-07-26 DIAGNOSIS — D34 Benign neoplasm of thyroid gland: Secondary | ICD-10-CM | POA: Insufficient documentation

## 2015-07-26 MED ORDER — SODIUM IODIDE I 131 CAPSULE
12.1200 | Freq: Once | INTRAVENOUS | Status: AC | PRN
  Administered 2015-07-26: 12.12 via ORAL

## 2015-07-27 ENCOUNTER — Encounter (HOSPITAL_COMMUNITY)
Admission: RE | Admit: 2015-07-27 | Discharge: 2015-07-27 | Disposition: A | Discharge status: Home / Self Care | Source: Ambulatory Visit | Attending: Internal Medicine | Admitting: Internal Medicine

## 2015-07-27 DIAGNOSIS — R7989 Other specified abnormal findings of blood chemistry: Secondary | ICD-10-CM | POA: Diagnosis present

## 2015-07-27 DIAGNOSIS — G4700 Insomnia, unspecified: Secondary | ICD-10-CM | POA: Diagnosis not present

## 2015-07-27 DIAGNOSIS — D34 Benign neoplasm of thyroid gland: Secondary | ICD-10-CM | POA: Diagnosis not present

## 2015-07-27 DIAGNOSIS — R5383 Other fatigue: Secondary | ICD-10-CM | POA: Diagnosis not present

## 2015-07-27 MED ORDER — SODIUM PERTECHNETATE TC 99M INJECTION
10.4300 | Freq: Once | INTRAVENOUS | Status: AC | PRN
  Administered 2015-07-27: 10 via INTRAVENOUS

## 2015-09-19 ENCOUNTER — Other Ambulatory Visit: Payer: Self-pay | Admitting: Internal Medicine

## 2015-09-19 DIAGNOSIS — E041 Nontoxic single thyroid nodule: Secondary | ICD-10-CM

## 2015-10-01 ENCOUNTER — Ambulatory Visit (HOSPITAL_COMMUNITY)

## 2016-05-30 ENCOUNTER — Other Ambulatory Visit: Payer: Self-pay | Admitting: Internal Medicine

## 2016-05-30 DIAGNOSIS — Z1231 Encounter for screening mammogram for malignant neoplasm of breast: Secondary | ICD-10-CM

## 2016-06-13 ENCOUNTER — Ambulatory Visit

## 2016-06-13 ENCOUNTER — Ambulatory Visit
Admission: RE | Admit: 2016-06-13 | Discharge: 2016-06-13 | Disposition: A | Discharge status: Home / Self Care | Source: Ambulatory Visit | Attending: Internal Medicine | Admitting: Internal Medicine

## 2016-06-13 DIAGNOSIS — Z1231 Encounter for screening mammogram for malignant neoplasm of breast: Secondary | ICD-10-CM

## 2017-02-12 ENCOUNTER — Other Ambulatory Visit: Payer: Self-pay | Admitting: General Surgery

## 2017-02-12 DIAGNOSIS — E0590 Thyrotoxicosis, unspecified without thyrotoxic crisis or storm: Secondary | ICD-10-CM

## 2017-02-20 ENCOUNTER — Ambulatory Visit
Admission: RE | Admit: 2017-02-20 | Discharge: 2017-02-20 | Disposition: A | Discharge status: Home / Self Care | Source: Ambulatory Visit | Attending: General Surgery | Admitting: General Surgery

## 2017-02-20 DIAGNOSIS — E0590 Thyrotoxicosis, unspecified without thyrotoxic crisis or storm: Secondary | ICD-10-CM

## 2017-03-20 ENCOUNTER — Other Ambulatory Visit: Payer: Self-pay | Admitting: General Surgery

## 2017-03-20 DIAGNOSIS — E041 Nontoxic single thyroid nodule: Secondary | ICD-10-CM

## 2017-03-25 ENCOUNTER — Ambulatory Visit
Admission: RE | Admit: 2017-03-25 | Discharge: 2017-03-25 | Disposition: A | Discharge status: Home / Self Care | Payer: TRICARE For Life (TFL) | Source: Ambulatory Visit | Attending: General Surgery | Admitting: General Surgery

## 2017-03-25 ENCOUNTER — Other Ambulatory Visit (HOSPITAL_COMMUNITY)
Admission: RE | Admit: 2017-03-25 | Discharge: 2017-03-25 | Disposition: A | Discharge status: Home / Self Care | Payer: TRICARE For Life (TFL) | Source: Ambulatory Visit | Attending: General Surgery | Admitting: General Surgery

## 2017-03-25 DIAGNOSIS — E041 Nontoxic single thyroid nodule: Secondary | ICD-10-CM | POA: Insufficient documentation

## 2017-04-20 ENCOUNTER — Emergency Department (HOSPITAL_COMMUNITY): Payer: TRICARE For Life (TFL)

## 2017-04-20 ENCOUNTER — Encounter (HOSPITAL_COMMUNITY): Payer: Self-pay

## 2017-04-20 ENCOUNTER — Emergency Department (HOSPITAL_COMMUNITY)
Admission: EM | Admit: 2017-04-20 | Discharge: 2017-04-20 | Disposition: A | Discharge status: Home / Self Care | Payer: TRICARE For Life (TFL) | Attending: Emergency Medicine | Admitting: Emergency Medicine

## 2017-04-20 DIAGNOSIS — R55 Syncope and collapse: Secondary | ICD-10-CM

## 2017-04-20 DIAGNOSIS — E119 Type 2 diabetes mellitus without complications: Secondary | ICD-10-CM | POA: Insufficient documentation

## 2017-04-20 DIAGNOSIS — F17200 Nicotine dependence, unspecified, uncomplicated: Secondary | ICD-10-CM | POA: Diagnosis not present

## 2017-04-20 HISTORY — DX: Type 2 diabetes mellitus without complications: E11.9

## 2017-04-20 LAB — CBC
HEMATOCRIT: 42.7 % (ref 36.0–46.0)
HEMOGLOBIN: 13.9 g/dL (ref 12.0–15.0)
MCH: 30.3 pg (ref 26.0–34.0)
MCHC: 32.6 g/dL (ref 30.0–36.0)
MCV: 93 fL (ref 78.0–100.0)
Platelets: 179 10*3/uL (ref 150–400)
RBC: 4.59 MIL/uL (ref 3.87–5.11)
RDW: 13.7 % (ref 11.5–15.5)
WBC: 8.6 10*3/uL (ref 4.0–10.5)

## 2017-04-20 LAB — D-DIMER, QUANTITATIVE (NOT AT ARMC): D-Dimer, Quant: 4.13 ug/mL-FEU — ABNORMAL HIGH (ref 0.00–0.50)

## 2017-04-20 LAB — BASIC METABOLIC PANEL
ANION GAP: 9 (ref 5–15)
BUN: 12 mg/dL (ref 6–20)
CHLORIDE: 104 mmol/L (ref 101–111)
CO2: 26 mmol/L (ref 22–32)
Calcium: 9.8 mg/dL (ref 8.9–10.3)
Creatinine, Ser: 0.76 mg/dL (ref 0.44–1.00)
GFR calc non Af Amer: >60 mL/min (ref >60)
GLUCOSE: 93 mg/dL (ref 65–99)
POTASSIUM: 3.9 mmol/L (ref 3.5–5.1)
Sodium: 139 mmol/L (ref 135–145)

## 2017-04-20 LAB — URINALYSIS, ROUTINE W REFLEX MICROSCOPIC
BILIRUBIN URINE: NEGATIVE
Glucose, UA: NEGATIVE mg/dL
HGB URINE DIPSTICK: NEGATIVE
Ketones, ur: NEGATIVE mg/dL
Leukocytes, UA: NEGATIVE
Nitrite: NEGATIVE
PH: 8 (ref 5.0–8.0)
Protein, ur: NEGATIVE mg/dL
SPECIFIC GRAVITY, URINE: 1.014 (ref 1.005–1.030)

## 2017-04-20 LAB — I-STAT TROPONIN, ED: Troponin i, poc: 0 ng/mL (ref 0.00–0.08)

## 2017-04-20 MED ORDER — IOPAMIDOL (ISOVUE-370) INJECTION 76%
INTRAVENOUS | Status: AC
  Administered 2017-04-20: 100 mL
  Filled 2017-04-20: qty 100

## 2017-04-20 MED ORDER — SODIUM CHLORIDE 0.9 % IV BOLUS (SEPSIS)
1000.0000 mL | Freq: Once | INTRAVENOUS | Status: AC
  Administered 2017-04-20: 1000 mL via INTRAVENOUS

## 2017-04-20 MED ORDER — KETOROLAC TROMETHAMINE 30 MG/ML IJ SOLN: 30.0000 mg | Freq: Once | INTRAMUSCULAR | Status: DC
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
## 2017-04-20 NOTE — ED Notes (Signed)
CBG 138

## 2017-04-20 NOTE — ED Notes (Signed)
ED Provider at bedside. 
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
## 2017-04-20 NOTE — Discharge Instructions (Signed)
Make sure to drink plenty of water and eat regularly. Please see your primary care provider in 2-3 days for follow-up today's visit and recheck of your symptoms. Please return to the emergency department if you develop any new or worsening symptoms.

## 2017-04-20 NOTE — ED Notes (Signed)
Pt ambulatory to the restroom.  

## 2017-04-20 NOTE — ED Notes (Signed)
Patient transported to CT 

## 2017-06-16 ENCOUNTER — Other Ambulatory Visit: Payer: Self-pay | Admitting: Internal Medicine

## 2017-06-16 DIAGNOSIS — Z1231 Encounter for screening mammogram for malignant neoplasm of breast: Secondary | ICD-10-CM

## 2017-06-22 ENCOUNTER — Ambulatory Visit
Admission: RE | Admit: 2017-06-22 | Discharge: 2017-06-22 | Disposition: A | Discharge status: Home / Self Care | Payer: TRICARE For Life (TFL) | Source: Ambulatory Visit | Attending: Internal Medicine | Admitting: Internal Medicine

## 2017-06-22 DIAGNOSIS — Z1231 Encounter for screening mammogram for malignant neoplasm of breast: Secondary | ICD-10-CM

## 2018-03-16 ENCOUNTER — Other Ambulatory Visit: Payer: Self-pay | Admitting: Surgery

## 2018-03-16 DIAGNOSIS — E041 Nontoxic single thyroid nodule: Secondary | ICD-10-CM

## 2018-03-23 ENCOUNTER — Ambulatory Visit
Admission: RE | Admit: 2018-03-23 | Discharge: 2018-03-23 | Disposition: A | Discharge status: Home / Self Care | Source: Ambulatory Visit | Attending: Surgery | Admitting: Surgery

## 2018-03-23 DIAGNOSIS — E041 Nontoxic single thyroid nodule: Secondary | ICD-10-CM

## 2018-05-19 ENCOUNTER — Other Ambulatory Visit: Payer: Self-pay | Admitting: Family Medicine

## 2018-05-19 DIAGNOSIS — Z1231 Encounter for screening mammogram for malignant neoplasm of breast: Secondary | ICD-10-CM

## 2018-06-28 ENCOUNTER — Ambulatory Visit
Admission: RE | Admit: 2018-06-28 | Discharge: 2018-06-28 | Disposition: A | Discharge status: Home / Self Care | Source: Ambulatory Visit | Attending: Family Medicine | Admitting: Family Medicine

## 2018-06-28 DIAGNOSIS — Z1231 Encounter for screening mammogram for malignant neoplasm of breast: Secondary | ICD-10-CM

## 2018-08-29 ENCOUNTER — Emergency Department (HOSPITAL_COMMUNITY)
Admission: EM | Admit: 2018-08-29 | Discharge: 2018-08-29 | Disposition: A | Discharge status: Home / Self Care | Attending: Emergency Medicine | Admitting: Emergency Medicine

## 2018-08-29 ENCOUNTER — Emergency Department (HOSPITAL_COMMUNITY)

## 2018-08-29 ENCOUNTER — Encounter (HOSPITAL_COMMUNITY): Payer: Self-pay | Admitting: Emergency Medicine

## 2018-08-29 DIAGNOSIS — E860 Dehydration: Secondary | ICD-10-CM | POA: Diagnosis not present

## 2018-08-29 DIAGNOSIS — X30XXXA Exposure to excessive natural heat, initial encounter: Secondary | ICD-10-CM | POA: Diagnosis not present

## 2018-08-29 DIAGNOSIS — F17200 Nicotine dependence, unspecified, uncomplicated: Secondary | ICD-10-CM | POA: Insufficient documentation

## 2018-08-29 DIAGNOSIS — E119 Type 2 diabetes mellitus without complications: Secondary | ICD-10-CM | POA: Diagnosis not present

## 2018-08-29 DIAGNOSIS — Z79899 Other long term (current) drug therapy: Secondary | ICD-10-CM | POA: Diagnosis not present

## 2018-08-29 DIAGNOSIS — R112 Nausea with vomiting, unspecified: Secondary | ICD-10-CM | POA: Diagnosis present

## 2018-08-29 LAB — COMPREHENSIVE METABOLIC PANEL
ALK PHOS: 80 U/L (ref 38–126)
ALT: 21 U/L (ref 0–44)
ANION GAP: 16 — AB (ref 5–15)
AST: 29 U/L (ref 15–41)
Albumin: 4 g/dL (ref 3.5–5.0)
BUN: 11 mg/dL (ref 8–23)
CALCIUM: 9.6 mg/dL (ref 8.9–10.3)
CO2: 18 mmol/L — AB (ref 22–32)
Chloride: 109 mmol/L (ref 98–111)
Creatinine, Ser: 0.87 mg/dL (ref 0.44–1.00)
GFR calc non Af Amer: >60 mL/min (ref >60)
Glucose, Bld: 102 mg/dL — ABNORMAL HIGH (ref 70–99)
Potassium: 3.1 mmol/L — ABNORMAL LOW (ref 3.5–5.1)
SODIUM: 143 mmol/L (ref 135–145)
Total Bilirubin: 0.7 mg/dL (ref 0.3–1.2)
Total Protein: 6.9 g/dL (ref 6.5–8.1)

## 2018-08-29 LAB — URINALYSIS, ROUTINE W REFLEX MICROSCOPIC
Bilirubin Urine: NEGATIVE
GLUCOSE, UA: NEGATIVE mg/dL
Hgb urine dipstick: NEGATIVE
Ketones, ur: 20 mg/dL — AB
LEUKOCYTES UA: NEGATIVE
NITRITE: NEGATIVE
PROTEIN: NEGATIVE mg/dL
Specific Gravity, Urine: 1.009 (ref 1.005–1.030)
pH: 8 (ref 5.0–8.0)

## 2018-08-29 LAB — I-STAT CG4 LACTIC ACID, ED
LACTIC ACID, VENOUS: 3.11 mmol/L — AB (ref 0.5–1.9)
Lactic Acid, Venous: 5.76 mmol/L (ref 0.5–1.9)

## 2018-08-29 LAB — CBC WITH DIFFERENTIAL/PLATELET
ABS IMMATURE GRANULOCYTES: 0 10*3/uL (ref 0.0–0.1)
BASOS ABS: 0 10*3/uL (ref 0.0–0.1)
BASOS PCT: 0 %
Eosinophils Absolute: 0 10*3/uL (ref 0.0–0.7)
Eosinophils Relative: 0 %
HCT: 44.6 % (ref 36.0–46.0)
Hemoglobin: 13.6 g/dL (ref 12.0–15.0)
Immature Granulocytes: 0 %
Lymphocytes Relative: 15 %
Lymphs Abs: 1.2 10*3/uL (ref 0.7–4.0)
MCH: 29.8 pg (ref 26.0–34.0)
MCHC: 30.5 g/dL (ref 30.0–36.0)
MCV: 97.8 fL (ref 78.0–100.0)
Monocytes Absolute: 0.5 10*3/uL (ref 0.1–1.0)
Monocytes Relative: 6 %
NEUTROS ABS: 6.2 10*3/uL (ref 1.7–7.7)
NEUTROS PCT: 79 %
PLATELETS: ADEQUATE 10*3/uL (ref 150–400)
RBC: 4.56 MIL/uL (ref 3.87–5.11)
RDW: 13.9 % (ref 11.5–15.5)
WBC: 7.9 10*3/uL (ref 4.0–10.5)

## 2018-08-29 LAB — RAPID URINE DRUG SCREEN, HOSP PERFORMED
Amphetamines: NOT DETECTED
Barbiturates: NOT DETECTED
Benzodiazepines: NOT DETECTED
Cocaine: NOT DETECTED
OPIATES: NOT DETECTED
TETRAHYDROCANNABINOL: POSITIVE — AB

## 2018-08-29 LAB — ETHANOL

## 2018-08-29 MED ORDER — LACTATED RINGERS IV BOLUS
1000.0000 mL | Freq: Once | INTRAVENOUS | Status: AC
  Administered 2018-08-29: 1000 mL via INTRAVENOUS

## 2018-08-29 MED ORDER — ONDANSETRON HCL 4 MG/2ML IJ SOLN
4.0000 mg | Freq: Once | INTRAMUSCULAR | Status: AC
  Administered 2018-08-29: 4 mg via INTRAVENOUS
  Filled 2018-08-29: qty 2

## 2018-08-29 MED ORDER — SODIUM CHLORIDE 0.9 % IV BOLUS
500.0000 mL | Freq: Once | INTRAVENOUS | Status: AC
  Administered 2018-08-29: 500 mL via INTRAVENOUS

## 2018-08-29 NOTE — ED Provider Notes (Addendum)
[place] EMERGENCY DEPARTMENT Provider Note   CSN: [id number] Arrival date & time: 08/29/18  1608     History   Chief Complaint Chief Complaint  Patient presents with  . Heat Exposure    HPI Emma Hampton is a 62 y.o. female.  62yo F w/ PMH including T2DM, fibroids who p/w getting overheated and nausea/vomiting.  Today, just prior to arrival, the patient was driving home from church and did not have air conditioning in her car.  She got very hot so when she got home she took off her clothes and turned water hose on herself for 30 minutes to try to cool down.  She began feeling lightheaded and nauseated and began having vomiting.  EMS was called and she was initially hypotensive but blood pressure quickly improved.  She feels generally weak.  She denies any chest pain or shortness of breath.  No fevers, cough, urinary symptoms, vomiting, or diarrhea.  She recently had trigger finger surgery and it is healing appropriately.  No new medications.  She states she has not had much to eat or drink today, had water and some champagne this morning.  The history is provided by the patient.    Past Medical History:  Diagnosis Date  . Diabetes mellitus without complication ([place])   . Fibroids   . Retroperitoneal abscess [facility])     Patient Active Problem List   Diagnosis Date Noted  . Meningitis, viral 09/10/2013    Past Surgical History:  Procedure Laterality Date  . BREAST BIOPSY Right   . CESAREAN SECTION    . CYST EXCISION    . TUBAL LIGATION       OB History    Gravida  3   Para      Term      Preterm      AB      Living  2     SAB      TAB      Ectopic      Multiple      Live Births               Home Medications    Prior to Admission medications   Medication Sig Start Date End Date Taking? Authorizing Provider  carboxymethylcellulose (REFRESH PLUS) 0.5 % SOLN Place 2 drops into both ears 4 (four) times daily as needed.     [provider]  cycloSPORINE (RESTASIS) 0.05 % ophthalmic emulsion Place 1 drop into both eyes 2 (two) times daily.    [provider]  guaiFENesin (ROBITUSSIN) 100 MG/5ML liquid Take 200 mg by mouth 3 (three) times daily as needed for cough.    [provider]  ibuprofen (ADVIL,MOTRIN) 200 MG tablet Take 200-600 mg by mouth every 6 (six) hours as needed for pain or headache.    [provider]  latanoprost (XALATAN) 0.005 % ophthalmic solution Place 1 drop into both eyes at bedtime.    [provider]  methocarbamol (ROBAXIN) 500 MG tablet Take 1 tablet (500 mg total) by mouth 2 (two) times daily. 02/15/14   [name], PA-C  Multiple Vitamin (MULTIVITAMIN) tablet Take 1 tablet by mouth daily.    [provider]  naproxen (NAPROSYN) 500 MG tablet Take 1 tablet (500 mg total) by mouth 2 (two) times daily with a meal. 02/15/14   [name], PA-C  naproxen sodium (ANAPROX) 220 MG tablet Take 220 mg by mouth daily as needed (for pain).  [provider]  Nutritional Supplements (ESTROVEN PO) Take 1 tablet by mouth daily.    [provider]  Olopatadine HCl (PATADAY) 0.2 % SOLN Apply 1 drop to eye 2 (two) times daily.    [provider]  pseudoephedrine (SUDAFED) 120 MG 12 hr tablet Take 120 mg by mouth daily as needed for congestion.    [provider]    Family History Family History  Problem Relation Age of Onset  . Heart disease Mother        MI  . Cancer Daughter        BREAST   LEFT MASECT  . Breast cancer Daughter   . Diabetes Maternal Uncle   . Cancer Cousin        IN LEGS    Social History Social History   Tobacco Use  . Smoking status: Current Some Day Smoker  . Smokeless tobacco: Never Used  Substance Use Topics  . Alcohol use: Yes    Alcohol/week: 1.0 standard drinks    Types: 1 Glasses of wine per week  . Drug use: No     Allergies   Codeine   Review of  Systems Review of Systems All other systems reviewed and are negative except that which was mentioned in HPI   Physical Exam Updated Vital Signs BP (!) 145/73   Pulse 67   Temp (!) 97.5 F (36.4 C) (Oral)   Resp 19   SpO2 99%   Physical Exam  Constitutional: She is oriented to person, place, and time. She appears well-developed and well-nourished. No distress.  shivering  HENT:  Head: Normocephalic and atraumatic.  Eyes: Pupils are equal, round, and reactive to light. Conjunctivae are normal.  Neck: Neck supple.  Cardiovascular: Normal rate, regular rhythm and normal heart sounds.  No murmur heard. Pulmonary/Chest: Effort normal and breath sounds normal.  Abdominal: Soft. Bowel sounds are normal. She exhibits no distension. There is no tenderness.  Musculoskeletal: She exhibits no edema.  Neurological: She is alert and oriented to person, place, and time.  Fluent speech  Skin: Skin is warm and dry.  Psychiatric:  Sleepy, bizarre affect, whispering during interview  Nursing note and vitals reviewed.    ED Treatments / Results  Labs (all labs ordered are listed, but only abnormal results are displayed) Labs Reviewed  COMPREHENSIVE METABOLIC PANEL - Abnormal; Notable for the following components:      Result Value   Potassium 3.1 (*)    CO2 18 (*)    Glucose, Bld 102 (*)    Anion gap 16 (*)    All other components within normal limits  RAPID URINE DRUG SCREEN, HOSP PERFORMED - Abnormal; Notable for the following components:   Tetrahydrocannabinol POSITIVE (*)    All other components within normal limits  URINALYSIS, ROUTINE W REFLEX MICROSCOPIC - Abnormal; Notable for the following components:   Color, Urine STRAW (*)    Ketones, ur 20 (*)    All other components within normal limits  I-STAT CG4 LACTIC ACID, ED - Abnormal; Notable for the following components:   Lactic Acid, Venous 5.76 (*)    All other components within normal limits  I-STAT CG4 LACTIC ACID, ED -  Abnormal; Notable for the following components:   Lactic Acid, Venous 3.11 (*)    All other components within normal limits  CBC WITH DIFFERENTIAL/PLATELET  ETHANOL    EKG EKG Interpretation  Date/Time:  Sunday August 29 2018 16:23:33 EDT Ventricular Rate:  74 PR Interval:  QRS Duration: 108 QT Interval:  413 QTC Calculation: 459 R Axis:   67 Text Interpretation:  Sinus rhythm Atrial premature complex Probable left atrial enlargement RSR' in V1 or V2, right VCD or RVH Left ventricular hypertrophy Baseline wander in lead(s) III similar to previous Confirmed by Theotis Burrow [phone]) on 08/29/2018 4:32:07 PM   Radiology Dg Chest Port 1 View  Result Date: 08/29/2018 CLINICAL DATA:  Hyperthermia. Smoker. EXAM: PORTABLE CHEST 1 VIEW COMPARISON:  Chest CTA dated 04/20/2017. FINDINGS: Enlarged cardiac silhouette. Clear lungs with normal vascularity. Thoracic and cervical spine degenerative changes and mild to moderate scoliosis. IMPRESSION: Cardiomegaly.  No acute abnormality. Electronically Signed   By: Claudie Revering M.D.   On: 08/29/2018 17:56    Procedures .Critical Care Performed by: Sharlett Iles, MD Authorized by: Sharlett Iles, MD   Critical care provider statement:    Critical care time (minutes):  30   Critical care time was exclusive of:  Separately billable procedures and treating other patients   Critical care was necessary to treat or prevent imminent or life-threatening deterioration of the following conditions: dehydration.   Critical care was time spent personally by me on the following activities:  Development of treatment plan with patient or surrogate, evaluation of patient's response to treatment, examination of patient, obtaining history from patient or surrogate, ordering and performing treatments and interventions, ordering and review of laboratory studies, ordering and review of radiographic studies and re-evaluation of patient's condition    (including critical care time)  Medications Ordered in ED Medications  sodium chloride 0.9 % bolus 500 mL (0 mLs Intravenous Stopped 08/29/18 1714)  ondansetron (ZOFRAN) injection 4 mg (4 mg Intravenous Given 08/29/18 2004)  lactated ringers bolus 1,000 mL (0 mLs Intravenous Stopped 08/29/18 1754)  lactated ringers bolus 1,000 mL (0 mLs Intravenous Stopped 08/29/18 2230)     Initial Impression / Assessment and Plan / ED Course  I have reviewed the triage vital signs and the nursing notes.  Pertinent labs & imaging results that were available during my care of the patient were reviewed by me and considered in my medical decision making (see chart for details).     Pt non-toxic on exam, BP mildly elevated, remainder of VSS. Gave IVF bolus, zofran. EKG without ischemic changes. She was hypothermic at 95.5. She denies any URI, urinary, or abdominal sx to suggest sepsis. I suspect that hypothermia was related to being hosed with water for 30 minutes.  Her lab work shows lactate 5.76, normal CBC, negative UA, negative chest x-ray.  The patient is noted to have a lactate>4. With the current information available to me, I don't think the patient is in septic shock. The lactate>4, is related to OTHER SHOCK dehydration..  After several liters of fluid, the patient was comfortable on reassessment and was able to tolerate p.o. after receiving Zofran.  Her vital signs have been stable here.  Given she has no other complaints, I feel she is safe for discharge.  I have extensively reviewed return precautions with her and emphasized the importance of aggressive hydration at home and avoidance of overheating.  She voiced understanding.  Final Clinical Impressions(s) / ED Diagnoses   Final diagnoses:  Dehydration    ED Discharge Orders    None       Nikola Marone, Wenda Overland, MD 08/30/18 0021    Rex Kras, Wenda Overland, MD 08/30/18 [phone]

## 2018-08-29 NOTE — ED Triage Notes (Signed)
Pt here after feeling hot while driving her car , no a/c on in car , pt got home stripped of her clothes off and poured water all over herself pt received 500 of fluid from ems

## 2018-08-29 NOTE — ED Notes (Addendum)
Pt requesting to be repositioned flat, she immediately became nauseated with heaving and slight dizziness.  Pt returned @ 45 degree position, pt reports "hot flash", pt medicated with Zofran and additional fluid infusing.  Pt A & O with no obvious deficits.

## 2018-08-29 NOTE — Discharge Instructions (Addendum)
Drink plenty of fluids over the next few days and avoid heat exposure. Return to ER if you have any breathing problems, chest pain, passing out episodes, vomiting, or sudden worsening of symptoms.

## 2019-04-28 ENCOUNTER — Other Ambulatory Visit: Payer: Self-pay | Admitting: Surgery

## 2019-04-28 DIAGNOSIS — E041 Nontoxic single thyroid nodule: Secondary | ICD-10-CM

## 2019-05-04 ENCOUNTER — Other Ambulatory Visit

## 2019-05-05 ENCOUNTER — Ambulatory Visit
Admission: RE | Admit: 2019-05-05 | Discharge: 2019-05-05 | Disposition: A | Discharge status: Home / Self Care | Source: Ambulatory Visit | Attending: Surgery | Admitting: Surgery

## 2019-05-05 DIAGNOSIS — E041 Nontoxic single thyroid nodule: Secondary | ICD-10-CM

## 2019-06-03 ENCOUNTER — Other Ambulatory Visit: Payer: Self-pay | Admitting: Family Medicine

## 2019-06-03 DIAGNOSIS — Z1231 Encounter for screening mammogram for malignant neoplasm of breast: Secondary | ICD-10-CM

## 2019-07-01 ENCOUNTER — Ambulatory Visit
Admission: RE | Admit: 2019-07-01 | Discharge: 2019-07-01 | Disposition: A | Discharge status: Home / Self Care | Source: Ambulatory Visit | Attending: Family Medicine | Admitting: Family Medicine

## 2019-07-01 ENCOUNTER — Other Ambulatory Visit: Payer: Self-pay

## 2019-07-01 DIAGNOSIS — Z1231 Encounter for screening mammogram for malignant neoplasm of breast: Secondary | ICD-10-CM

## 2019-11-03 ENCOUNTER — Ambulatory Visit (INDEPENDENT_AMBULATORY_CARE_PROVIDER_SITE_OTHER): Payer: Medicare Other | Admitting: Plastic Surgery

## 2019-11-03 ENCOUNTER — Other Ambulatory Visit: Payer: Self-pay

## 2019-11-03 ENCOUNTER — Encounter: Payer: Self-pay | Admitting: Plastic Surgery

## 2019-11-03 VITALS — BP 111/75 | HR 77 | Temp 97.3°F | Ht 64.0 in | Wt 150.2 lb

## 2019-11-03 DIAGNOSIS — L723 Sebaceous cyst: Secondary | ICD-10-CM | POA: Diagnosis not present

## 2019-11-03 NOTE — Progress Notes (Signed)
Referring Provider Lucianne Lei, MD [address] [place],  [place] [address]   CC:  Chief Complaint  Patient presents with  . Advice Only    for (R) cheek cyst      Rasheeda Bibian Musich is an 63 y.o. female.  HPI: Patient is here to discuss her right cheek cyst.  Is been present for several months.  It reached a point where her dermatologist attempted to excise this.  However he felt that it was going too deep and was not able to get all of it.  This was about a week and a half ago.  Since then she has been on antibiotics.  She feels like the swelling is slowly going down.  She wants to have a definitive excision of the cyst.  Allergies  Allergen Reactions  . Codeine Nausea And Vomiting    Outpatient Encounter Medications as of 11/03/2019  Medication Sig Note  . Multiple Vitamin (MULTIVITAMIN) tablet Take 1 tablet by mouth daily.   . [DISCONTINUED] carboxymethylcellulose (REFRESH PLUS) 0.5 % SOLN Place 2 drops into both ears 4 (four) times daily as needed.   . [DISCONTINUED] cycloSPORINE (RESTASIS) 0.05 % ophthalmic emulsion Place 1 drop into both eyes 2 (two) times daily.   . [DISCONTINUED] guaiFENesin (ROBITUSSIN) 100 MG/5ML liquid Take 200 mg by mouth 3 (three) times daily as needed for cough.   . [DISCONTINUED] ibuprofen (ADVIL,MOTRIN) 200 MG tablet Take 200-600 mg by mouth every 6 (six) hours as needed for pain or headache.   . [DISCONTINUED] latanoprost (XALATAN) 0.005 % ophthalmic solution Place 1 drop into both eyes at bedtime.   . [DISCONTINUED] methocarbamol (ROBAXIN) 500 MG tablet Take 1 tablet (500 mg total) by mouth 2 (two) times daily.   . [DISCONTINUED] naproxen (NAPROSYN) 500 MG tablet Take 1 tablet (500 mg total) by mouth 2 (two) times daily with a meal.   . [DISCONTINUED] naproxen sodium (ANAPROX) 220 MG tablet Take 220 mg by mouth daily as needed (for pain).   . [DISCONTINUED] Nutritional Supplements (ESTROVEN PO) Take 1 tablet by mouth daily. 09/10/2013: OTC  dose unknown  . [DISCONTINUED] Olopatadine HCl (PATADAY) 0.2 % SOLN Apply 1 drop to eye 2 (two) times daily.   . [DISCONTINUED] pseudoephedrine (SUDAFED) 120 MG 12 hr tablet Take 120 mg by mouth daily as needed for congestion.    No facility-administered encounter medications on file as of 11/03/2019.      Past Medical History:  Diagnosis Date  . Diabetes mellitus without complication ([place])   . Fibroids   . Retroperitoneal abscess [facility])     Past Surgical History:  Procedure Laterality Date  . BREAST BIOPSY Right   . CESAREAN SECTION    . CYST EXCISION    . TUBAL LIGATION      Family History  Problem Relation Age of Onset  . Heart disease Mother        MI  . Cancer Daughter        BREAST   LEFT MASECT  . Breast cancer Daughter   . Diabetes Maternal Uncle   . Cancer Cousin        IN LEGS    Social History   Social History Narrative  . Not on file     Review of Systems General: Denies fevers, chills, weight loss CV: Denies chest pain, shortness of breath, palpitations  Physical Exam Vitals with BMI 11/03/2019 08/29/2018 08/29/2018  Height 5\' 4"  - -  Weight 150 lbs 3 oz - -  BMI [id number] - -  Systolic [id number]  Diastolic 75 73 74  Pulse 77 67 -    General:  No acute distress,  Alert and oriented, Non-Toxic, Normal speech and affect HEENT: Normocephalic atraumatic.  Cranial nerves grossly intact.  She has a transverse scar in the mid cheek area on the right side with surrounding swelling and mild erythema.  There is a large pigmented lesion that is growing hair lateral to this.  She says the pigmented lesion has not grown and she has had her whole life.  Assessment/Plan Patient presents with a partially excised and drained right cheek cyst.  I think it would be reasonable to do a definitive excision of this after a little over a month this past.  This will allow the swelling to go down and allow a more definitive and ultimately smaller local excision.  We will plan to  do this in the office at a later time point.  I did discuss the risk that include bleeding, infection, demonstrating structures, need for additional procedures.  Cindra Presume 11/03/2019, 3:09 PM

## 2019-12-29 ENCOUNTER — Other Ambulatory Visit: Payer: Self-pay

## 2019-12-29 ENCOUNTER — Other Ambulatory Visit (HOSPITAL_COMMUNITY)
Admission: RE | Admit: 2019-12-29 | Discharge: 2019-12-29 | Disposition: A | Discharge status: Home / Self Care | Payer: Medicare Other | Source: Ambulatory Visit | Attending: Plastic Surgery | Admitting: Plastic Surgery

## 2019-12-29 ENCOUNTER — Ambulatory Visit (INDEPENDENT_AMBULATORY_CARE_PROVIDER_SITE_OTHER): Payer: Medicare Other | Admitting: Plastic Surgery

## 2019-12-29 ENCOUNTER — Encounter: Payer: Self-pay | Admitting: Plastic Surgery

## 2019-12-29 VITALS — BP 156/91 | HR 80 | Temp 99.0°F | Ht 64.0 in | Wt 149.0 lb

## 2019-12-29 DIAGNOSIS — L732 Hidradenitis suppurativa: Secondary | ICD-10-CM | POA: Diagnosis present

## 2019-12-29 DIAGNOSIS — L723 Sebaceous cyst: Secondary | ICD-10-CM | POA: Diagnosis not present

## 2019-12-29 NOTE — Progress Notes (Signed)
Operative Note   DATE OF OPERATION: 12/29/2019  LOCATION:    SURGICAL DEPARTMENT: Plastic Surgery  PREOPERATIVE DIAGNOSES: 1.  Right cheek cyst 2.  Right lower lip lesion  POSTOPERATIVE DIAGNOSES:  same  PROCEDURE:  1. Excision of right cheek cyst measuring 3.5 cm 2. Complex closure closure right cheek measuring 3.5 cm 3. Excision right lower lip lesion measuring 1.2 cm 4. Complex closure right lower lip measuring 1.2 cm  SURGEON: Talmadge Coventry, MD  ANESTHESIA:  Local  COMPLICATIONS: None.   INDICATIONS FOR PROCEDURE:  The patient, Emma Hampton is a 64 y.o. female born on 04-Oct-1956, is here for treatment of treatment of right cheek cyst and right lower lip lesion MRN: [id number]  CONSENT:  Informed consent was obtained directly from the patient. Risks, benefits and alternatives were fully discussed. Specific risks including but not limited to bleeding, infection, hematoma, seroma, scarring, pain, infection, wound healing problems, and need for further surgery were all discussed. The patient did have an ample opportunity to have questions answered to satisfaction.   DESCRIPTION OF PROCEDURE:  Local anesthesia was administered. The patient's operative site was prepped and draped in a sterile fashion. A time out was performed and all information was confirmed to be correct.  The lesions were excised with a 15 blade.  Hemostasis was obtained.  Circumferential undermining was performed and the skin was advanced and closed in layers with interrupted buried Monocryl sutures and 4-0 Monocryl for the skin.     The patient tolerated the procedure well.  There were no complications.

## 2020-01-02 LAB — SURGICAL PATHOLOGY

## 2020-01-05 ENCOUNTER — Encounter: Payer: Self-pay | Admitting: Plastic Surgery

## 2020-01-05 ENCOUNTER — Ambulatory Visit (INDEPENDENT_AMBULATORY_CARE_PROVIDER_SITE_OTHER): Payer: Medicare Other | Admitting: Plastic Surgery

## 2020-01-05 ENCOUNTER — Other Ambulatory Visit: Payer: Self-pay

## 2020-01-05 VITALS — BP 154/91 | HR 90 | Temp 97.1°F | Ht 64.0 in | Wt 148.2 lb

## 2020-01-05 DIAGNOSIS — L723 Sebaceous cyst: Secondary | ICD-10-CM

## 2020-01-05 NOTE — Progress Notes (Signed)
Patient is here postop from cyst excision of right cheek and right lower lip.  These were both benign on pathology.  She is happy with how she is healing and has no complaints.  On exam everything looks fine and all the sutures were removed.  She does point out a another cyst that is on the posterior aspect of her neck that she would like removed.  It is growing in and is intermittently painful.  It is about 2 cm to 3 cm in size near the midline of the posterior neck with a central punctum.  I suspect that this is similar to the other 2 lesions.  I think it warrants excision and she agrees.  I discussed the risk that include bleeding, infection, damage surrounding structures, need for additional procedures, potential for lesion recurrence.  We will plan to schedule this in the office under local.  I will follow up with her on the healing of the cheek and lip when we do this next month.

## 2020-01-19 ENCOUNTER — Ambulatory Visit (INDEPENDENT_AMBULATORY_CARE_PROVIDER_SITE_OTHER): Payer: Medicare Other | Admitting: Surgical

## 2020-01-19 ENCOUNTER — Other Ambulatory Visit: Payer: Self-pay

## 2020-01-19 ENCOUNTER — Encounter: Payer: Self-pay | Admitting: Surgical

## 2020-01-19 VITALS — BP 126/79 | HR 73 | Temp 97.5°F | Ht 64.0 in | Wt 151.4 lb

## 2020-01-19 DIAGNOSIS — L723 Sebaceous cyst: Secondary | ICD-10-CM

## 2020-01-19 NOTE — Progress Notes (Signed)
The patient is a 64 year old female here for follow-up after excision of sebaceous cyst of right cheek and right lower lip.  She has healed really well.  She is here for evaluation of a suture knot that is causing her some discomfort.  Today on evaluation she has a small Monocryl knot noted at the medial aspect of the right cheek incision.  I was able to remove this using pickups and a scissor.  Her incision is well-healed.  There is no periincisional erythema.  There is no sign of infection.    Call with questions or concerns.  She is an appointment scheduled for 02/09/2020 with Dr. Claudia Desanctis for evaluation/excision of additional lesion.

## 2020-02-09 ENCOUNTER — Ambulatory Visit: Admitting: Plastic Surgery

## 2020-05-09 ENCOUNTER — Other Ambulatory Visit: Payer: Self-pay | Admitting: Internal Medicine

## 2020-05-09 DIAGNOSIS — E041 Nontoxic single thyroid nodule: Secondary | ICD-10-CM

## 2020-05-23 ENCOUNTER — Ambulatory Visit
Admission: RE | Admit: 2020-05-23 | Discharge: 2020-05-23 | Disposition: A | Discharge status: Home / Self Care | Payer: Medicare Other | Source: Ambulatory Visit | Attending: Internal Medicine | Admitting: Internal Medicine

## 2020-05-23 DIAGNOSIS — E041 Nontoxic single thyroid nodule: Secondary | ICD-10-CM

## 2020-10-24 ENCOUNTER — Other Ambulatory Visit: Payer: Self-pay | Admitting: Family Medicine

## 2020-10-24 DIAGNOSIS — Z1231 Encounter for screening mammogram for malignant neoplasm of breast: Secondary | ICD-10-CM

## 2020-12-04 ENCOUNTER — Ambulatory Visit
Admission: RE | Admit: 2020-12-04 | Discharge: 2020-12-04 | Disposition: A | Discharge status: Home / Self Care | Payer: Medicare Other | Source: Ambulatory Visit | Attending: Family Medicine | Admitting: Family Medicine

## 2020-12-04 ENCOUNTER — Other Ambulatory Visit: Payer: Self-pay

## 2020-12-04 DIAGNOSIS — Z1231 Encounter for screening mammogram for malignant neoplasm of breast: Secondary | ICD-10-CM

## 2020-12-18 ENCOUNTER — Ambulatory Visit: Payer: Medicare Other | Attending: Internal Medicine

## 2020-12-18 DIAGNOSIS — Z23 Encounter for immunization: Secondary | ICD-10-CM

## 2020-12-18 NOTE — Progress Notes (Signed)
   Covid-19 Vaccination Clinic  Name:  Emma Hampton    MRN: [id number] DOB: 1956-06-22  12/18/2020  [name] was observed post Covid-19 immunization for 15 minutes without incident. She was provided with Vaccine Information Sheet and instruction to access the V-Safe system.   [name] was instructed to call 911 with any severe reactions post vaccine: Marland Kitchen Difficulty breathing  . Swelling of face and throat  . A fast heartbeat  . A bad rash all over body  . Dizziness and weakness   Immunizations Administered    Name Date Dose VIS Date Route   Pfizer COVID-19 Vaccine 12/18/2020  2:36 PM 0.3 mL 10/10/2020 Intramuscular   Manufacturer: [employer], [employer]   Lot: [id number]   NDC: [address]-5

## 2020-12-26 ENCOUNTER — Other Ambulatory Visit: Payer: Self-pay

## 2020-12-26 ENCOUNTER — Ambulatory Visit (INDEPENDENT_AMBULATORY_CARE_PROVIDER_SITE_OTHER): Payer: Medicare Other | Admitting: Plastic Surgery

## 2020-12-26 ENCOUNTER — Encounter: Payer: Self-pay | Admitting: Plastic Surgery

## 2020-12-26 VITALS — BP 133/84 | HR 78 | Temp 98.1°F

## 2020-12-26 DIAGNOSIS — L989 Disorder of the skin and subcutaneous tissue, unspecified: Secondary | ICD-10-CM | POA: Diagnosis not present

## 2020-12-26 NOTE — Progress Notes (Signed)
   Referring Provider Renaye Rakers, MD [address] [place],  [place] [address]   CC:  Chief Complaint  Patient presents with  . Follow-up      Emma Hampton is an 65 y.o. female.  HPI: Patient is here for evaluation of new cystic lesions.  She did have one on her cheek that I removed previously and wants me to look at one behind her neck, in front of her right ear, and on her right lower back.  They are symptomatic for her and bother her.  They will intermittently drain and very in size depending on the level of inflammation.  Review of Systems General: Denies fevers or chills  Physical Exam Vitals with BMI 12/26/2020 01/19/2020 01/05/2020  Height - 5\' 4"  5\' 4"   Weight - 151 lbs 6 oz 148 lbs 3 oz  BMI - 25.98 25.43  Systolic 133 126  Diastolic 84 79 91  Pulse 78 73 90    General:  No acute distress,  Alert and oriented, Non-Toxic, Normal speech and affect Examination of the right preauricular area shows 1 to 2 cm cystic lesion with a focal punctum.  Examination of the posterior neck shows a cystic lesion with a punctum that is about 2 cm in size.  Examination of the lower back shows a pigmented cystic lesion that is 2 to 3 cm in size with a punctum all suggestive of sebaceous cysts.  Assessment/Plan Patient presents with several skin lesions that appear consistent with sebaceous cyst.  We discussed excision under local.  We discussed the risks include bleeding, infection, damage surrounding structures and need for additional procedures.  All of her questions were answered and we will plan to proceed with excision in the office.  12/26/2020, 3:05 PM

## 2020-12-27 ENCOUNTER — Ambulatory Visit
Admission: RE | Admit: 2020-12-27 | Discharge: 2020-12-27 | Disposition: A | Discharge status: Home / Self Care | Payer: Medicare Other | Source: Ambulatory Visit | Attending: Nurse Practitioner | Admitting: Nurse Practitioner

## 2020-12-27 ENCOUNTER — Other Ambulatory Visit: Payer: Self-pay | Admitting: Nurse Practitioner

## 2020-12-27 DIAGNOSIS — M25562 Pain in left knee: Secondary | ICD-10-CM

## 2021-02-06 ENCOUNTER — Other Ambulatory Visit (HOSPITAL_COMMUNITY)
Admission: RE | Admit: 2021-02-06 | Discharge: 2021-02-06 | Disposition: A | Discharge status: Home / Self Care | Payer: Medicare Other | Source: Ambulatory Visit | Attending: Plastic Surgery | Admitting: Plastic Surgery

## 2021-02-06 ENCOUNTER — Ambulatory Visit (INDEPENDENT_AMBULATORY_CARE_PROVIDER_SITE_OTHER): Payer: Medicare Other | Admitting: Plastic Surgery

## 2021-02-06 ENCOUNTER — Other Ambulatory Visit: Payer: Self-pay

## 2021-02-06 DIAGNOSIS — L989 Disorder of the skin and subcutaneous tissue, unspecified: Secondary | ICD-10-CM | POA: Insufficient documentation

## 2021-02-06 DIAGNOSIS — L720 Epidermal cyst: Secondary | ICD-10-CM

## 2021-02-06 NOTE — Progress Notes (Signed)
Operative Note   DATE OF OPERATION: 02/06/2021  LOCATION:    SURGICAL DEPARTMENT: Plastic Surgery  PREOPERATIVE DIAGNOSES:   1. Right preauricular cyst 2. Posterior neck cyst 3. Back cyst  POSTOPERATIVE DIAGNOSES:  same  PROCEDURE:  1. Excision of right preauricular cyst measuring 2 cm 2. Excision posterior neck cyst 2.5 cm 3. Complex closure neck and cheek measuring 4.5 cm 4. Excision back cyst 3 cm 5. Complex closure back 3 cm  SURGEON: Talmadge Coventry, MD  ANESTHESIA:  Local  COMPLICATIONS: None.   INDICATIONS FOR PROCEDURE:  The patient, Emma Hampton is a 65 y.o. female born on 03/01/1956, is here for treatment of multiple cysts. MRN: [id number]  CONSENT:  Informed consent was obtained directly from the patient. Risks, benefits and alternatives were fully discussed. Specific risks including but not limited to bleeding, infection, hematoma, seroma, scarring, pain, infection, wound healing problems, and need for further surgery were all discussed. The patient did have an ample opportunity to have questions answered to satisfaction.   DESCRIPTION OF PROCEDURE:  Local anesthesia was administered. The patient's operative site was prepped and draped in a sterile fashion. A time out was performed and all information was confirmed to be correct.  The lesions were excised with a 15 blade.  Hemostasis was obtained.  Circumferential undermining was performed and the skin was advanced and closed in layers with interrupted buried Monocryl sutures and 5-0 fast gut for the skin.  The back skin was closed with monocryl.   The patient tolerated the procedure well.  There were no complications.

## 2021-02-11 LAB — SURGICAL PATHOLOGY

## 2021-02-27 ENCOUNTER — Ambulatory Visit (INDEPENDENT_AMBULATORY_CARE_PROVIDER_SITE_OTHER): Payer: Medicare Other | Admitting: Plastic Surgery

## 2021-02-27 ENCOUNTER — Other Ambulatory Visit: Payer: Self-pay

## 2021-02-27 ENCOUNTER — Encounter: Payer: Self-pay | Admitting: Plastic Surgery

## 2021-02-27 VITALS — BP 127/81 | HR 86

## 2021-02-27 DIAGNOSIS — L723 Sebaceous cyst: Secondary | ICD-10-CM | POA: Diagnosis not present

## 2021-02-27 NOTE — Progress Notes (Signed)
Patient presents about 6 weeks postop from excision of cyst from her right preauricular area, posterior neck, and mid back.  Pathology was reviewed on all of these and was consistent with epidermal inclusion cyst.  She is happy with how things are going and only complains of a little bit of itching of her back.  On exam all these looks to have healed fine.  There is still some Monocryl sutures in the back which I removed.  We discussed scar management and healing and she will follow-up on an as-needed basis.  All of her questions were answered.

## 2021-06-05 ENCOUNTER — Emergency Department (HOSPITAL_COMMUNITY)
Admission: EM | Admit: 2021-06-05 | Discharge: 2021-06-05 | Disposition: A | Discharge status: Home / Self Care | Payer: Medicare Other | Attending: Emergency Medicine | Admitting: Emergency Medicine

## 2021-06-05 ENCOUNTER — Encounter (HOSPITAL_COMMUNITY): Payer: Self-pay | Admitting: Emergency Medicine

## 2021-06-05 ENCOUNTER — Other Ambulatory Visit: Payer: Self-pay

## 2021-06-05 DIAGNOSIS — T782XXA Anaphylactic shock, unspecified, initial encounter: Secondary | ICD-10-CM

## 2021-06-05 DIAGNOSIS — E119 Type 2 diabetes mellitus without complications: Secondary | ICD-10-CM | POA: Diagnosis not present

## 2021-06-05 DIAGNOSIS — S60460A Insect bite (nonvenomous) of right index finger, initial encounter: Secondary | ICD-10-CM | POA: Insufficient documentation

## 2021-06-05 DIAGNOSIS — W57XXXA Bitten or stung by nonvenomous insect and other nonvenomous arthropods, initial encounter: Secondary | ICD-10-CM | POA: Insufficient documentation

## 2021-06-05 DIAGNOSIS — Z87891 Personal history of nicotine dependence: Secondary | ICD-10-CM | POA: Diagnosis not present

## 2021-06-05 MED ORDER — SODIUM CHLORIDE 0.9 % IV BOLUS
1000.0000 mL | Freq: Once | INTRAVENOUS | Status: AC
  Administered 2021-06-05: 1000 mL via INTRAVENOUS

## 2021-06-05 MED ORDER — FAMOTIDINE IN NACL 20-0.9 MG/50ML-% IV SOLN
20.0000 mg | Freq: Once | INTRAVENOUS | Status: AC
  Administered 2021-06-05: 20 mg via INTRAVENOUS
  Filled 2021-06-05: qty 50

## 2021-06-05 MED ORDER — EPINEPHRINE 0.3 MG/0.3ML IJ SOAJ: 0.3000 mg | INTRAMUSCULAR | 0 refills | Status: DC | PRN

## 2021-06-05 NOTE — ED Provider Notes (Signed)
[facility] DEPT Provider Note   CSN: [id number] Arrival date & time: 06/05/21  1107     History Chief Complaint  Patient presents with   Insect Bite    Emma Hampton is a 65 y.o. female with no significant past medical history presents to the ED by EMS for evaluation of possible allergic reaction to a bee sting.  Patient was at side and felt a sting in her right index finger.  She saw a bee attached to her skin.  She removed it and noticed almost immediately some swelling, itching and redness at the base of her finger then spread to her entire hand.  A few minutes later she developed tingling sensation, swelling, redness of her left hand.  Then her feet both were tingly and uncomfortable to walk on.  Also developed nausea, vomiting, lightheadedness like she was going to faint, diffuse itching.  Had tightness discomfort in her throat.  She called 911.  She was given epinephrine IM, cell Medrol, Benadryl, IV fluids on route.  Per EMS report patient's systolic BP was in the 53I when they arrived.  This improved after medicines and IV fluids.  Overall patient feels significantly better.  Has some lingering swelling and redness of her right hand where the bee stung her.  Currently denies throat tightness, chest pain, shortness of breath, nausea, vomiting.  No abdominal pain.  No headache.  No syncope.  She has no known allergies.  Did not know that she could have an allergy to bees.  HPI     Past Medical History:  Diagnosis Date   Diabetes mellitus without complication ([place])    Fibroids    Retroperitoneal abscess [facility])     Patient Active Problem List   Diagnosis Date Noted   Meningitis, viral 09/10/2013    Past Surgical History:  Procedure Laterality Date   BREAST BIOPSY Right    CESAREAN SECTION     CYST EXCISION     TUBAL LIGATION       OB History     Gravida  3   Para      Term      Preterm      AB      Living  2      SAB       IAB      Ectopic      Multiple      Live Births              Family History  Problem Relation Age of Onset   Heart disease Mother        MI   Cancer Daughter        BREAST   LEFT MASECT   Breast cancer Daughter    Diabetes Maternal Uncle    Cancer Cousin        IN LEGS    Social History   Tobacco Use   Smoking status: Former    Pack years: 0.00    Types: Cigarettes    Quit date: 11/03/2015    Years since quitting: 5.5   Smokeless tobacco: Never  Substance Use Topics   Alcohol use: Yes    Alcohol/week: 1.0 standard drink    Types: 1 Glasses of wine per week   Drug use: No    Home Medications Prior to Admission medications   Medication Sig Start Date End Date Taking? Authorizing Provider  EPINEPHrine 0.3 mg/0.3 mL IJ SOAJ injection Inject 0.3 mg into the  muscle as needed for anaphylaxis. 06/05/21  Yes Kinnie Feil, PA-C  ascorbic acid (VITAMIN C) 500 MG tablet Take 500 mg by mouth daily.    [provider]  atorvastatin (LIPITOR) 20 MG tablet Take 20 mg by mouth at bedtime. 01/16/20   [provider]  Multiple Vitamin (MULTIVITAMIN) tablet Take 1 tablet by mouth daily.    [provider]    Allergies    Codeine  Review of Systems   Review of Systems  HENT:         Throat tightness   Gastrointestinal:  Positive for nausea and vomiting.  Musculoskeletal:  Positive for arthralgias.  Skin:  Positive for color change.       Itching   Neurological:  Positive for light-headedness.  All other systems reviewed and are negative.  Physical Exam Updated Vital Signs BP 140/77   Pulse 61   Temp 98.7 F (37.1 C) (Oral)   Resp 16   Ht 5\' 4"  (1.626 m)   Wt 60.3 kg   SpO2 100%   BMI 22.83 kg/m   Physical Exam Vitals and nursing note reviewed.  Constitutional:      General: She is not in acute distress.    Appearance: She is well-developed.     Comments: NAD.  HENT:     Head: Normocephalic and atraumatic.      Comments: No obvious facial, lip edema    Right Ear: External ear normal.     Left Ear: External ear normal.     Nose: Nose normal.     Mouth/Throat:     Comments: No obvious lip, tongue, oropharyngeal edema Eyes:     General: No scleral icterus.    Conjunctiva/sclera: Conjunctivae normal.  Cardiovascular:     Rate and Rhythm: Normal rate and regular rhythm.     Heart sounds: Normal heart sounds. No murmur heard. Pulmonary:     Effort: Pulmonary effort is normal.     Breath sounds: Normal breath sounds. No wheezing.     Comments: Normal work of breathing.  No wheezing. Abdominal:     Palpations: Abdomen is soft.     Tenderness: There is no abdominal tenderness.  Musculoskeletal:        General: No deformity. Normal range of motion.     Cervical back: Normal range of motion and neck supple.  Skin:    General: Skin is warm and dry.     Capillary Refill: Capillary refill takes less than 2 seconds.     Comments: Erythema, edema, warmth at the base of the right index finger.  Examined the skin for bee sting area but unable to visualize this. Excoriation marks in right forearm. No urticaria appreciated on upper/lower extremities, chest, abdomen, back.   Neurological:     Mental Status: She is alert and oriented to person, place, and time.  Psychiatric:        Behavior: Behavior normal.        Thought Content: Thought content normal.        Judgment: Judgment normal.    ED Results / Procedures / Treatments   Labs (all labs ordered are listed, but only abnormal results are displayed) Labs Reviewed - No data to display  EKG None  Radiology No results found.  Procedures Procedures   Medications Ordered in ED Medications  famotidine (PEPCID) IVPB 20 mg premix (has no administration in time range)  sodium chloride 0.9 % bolus 1,000 mL (1,000 mLs Intravenous New Bag/Given 06/05/21  1208)    ED Course  I have reviewed the triage vital signs and the nursing notes.  Pertinent  labs & imaging results that were available during my care of the patient were reviewed by me and considered in my medical decision making (see chart for details).    MDM Rules/Calculators/A&P                          65 year old female presents to the ED for what sounds like an anaphylactic reaction after bee sting.  Reported symptoms include hypotension, hand and feet swelling, facial swelling, throat tightness, lightheadedness, vomiting, diarrhea and hypotension, pruritus.  No known allergies previous to this.  She was given EpiPen, Solu-Medrol, Benadryl and IV fluids prior to arrival.  On arrival she had lingering swelling at the site of bee sting but otherwise is asymptomatic.  Blood pressure was normal on arrival.  Exam is reassuring.  Patient is given Pepcid here, IV fluids.  She was reevaluated and had normal vital signs and no return of symptoms.  She is at this time asymptomatic.  She has been monitored in the ED for close to 3 hours without any return of symptoms.  Appropriate for discharge with EpiPen prescription.  She was educated on how to use this and when to use it.  Return precautions discussed.  Discussed with EDP Zammit.  Final Clinical Impression(s) / ED Diagnoses Final diagnoses:  Insect bite of right index finger, initial encounter  Anaphylaxis, initial encounter    Rx / DC Orders ED Discharge Orders          Ordered    EPINEPHrine 0.3 mg/0.3 mL IJ SOAJ injection  As needed        06/05/21 [place], Mishawn Hemann J, PA-C 06/05/21 1437    Milton Ferguson, MD 06/07/21 [phone]

## 2021-06-05 NOTE — ED Triage Notes (Signed)
Patient arrives via EMS from her home after being stung by a bee to the right hand.  Patient reports increased edema to the right hand and radiating up to her elbow.  EMS reported that the patient was alert and oriented.  Initial BP was hypotensive but after medications and treatment, the BP stabilized.  Patient co having itching after the bee sting.

## 2021-06-05 NOTE — Discharge Instructions (Addendum)
Your symptoms most likely due to an allergic reaction  I prescribed you an EpiPen that you should carry with you can use whenever you develop severe symptoms of an allergic reaction.  See attached information about anaphylaxis.  Use your EpiPen whenever you develop sudden onset facial, tongue, throat swelling or tightness, chest pain, shortness of breath, passing out, fever after an exposure to allergen  Return to the ED immediately for symptoms above

## 2021-06-05 NOTE — ED Notes (Signed)
Patient was given a Ginger Ale.

## 2021-12-02 ENCOUNTER — Other Ambulatory Visit: Payer: Self-pay | Admitting: Family Medicine

## 2021-12-02 DIAGNOSIS — Z1231 Encounter for screening mammogram for malignant neoplasm of breast: Secondary | ICD-10-CM

## 2021-12-10 ENCOUNTER — Ambulatory Visit
Admission: RE | Admit: 2021-12-10 | Discharge: 2021-12-10 | Disposition: A | Discharge status: Home / Self Care | Payer: Medicare Other | Source: Ambulatory Visit | Attending: Family Medicine | Admitting: Family Medicine

## 2021-12-10 DIAGNOSIS — Z1231 Encounter for screening mammogram for malignant neoplasm of breast: Secondary | ICD-10-CM

## 2022-04-21 IMAGING — MG DIGITAL SCREENING BILAT W/ TOMO W/ CAD
8 series · 9 of 24 positions shown · non-contrast
Comparison: Previous exam(s).

CLINICAL DATA: Screening.

EXAM:
DIGITAL SCREENING BILATERAL MAMMOGRAM WITH TOMO AND CAD

[R CC synth-2D]
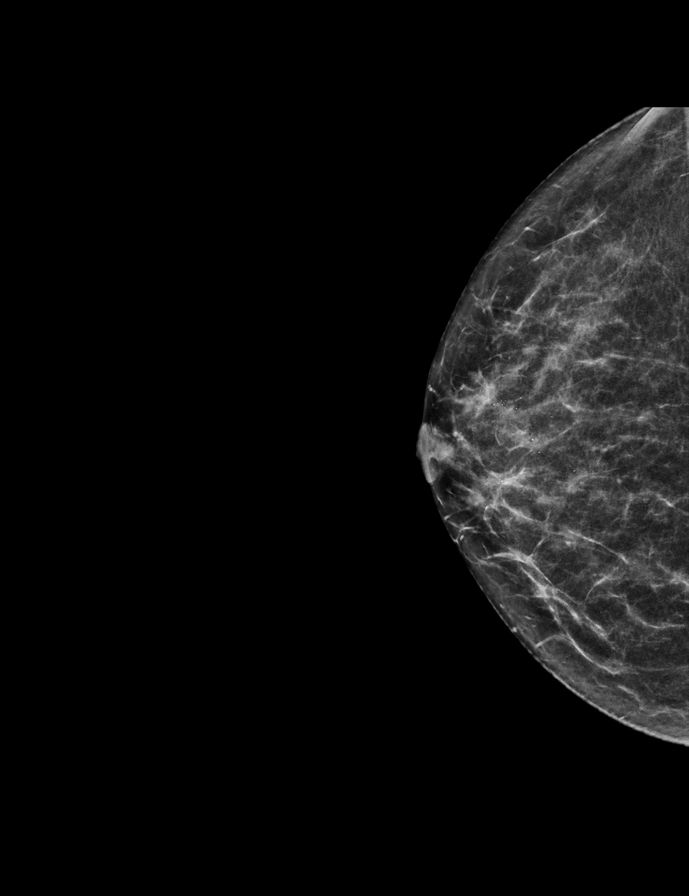

[L CC synth-2D]
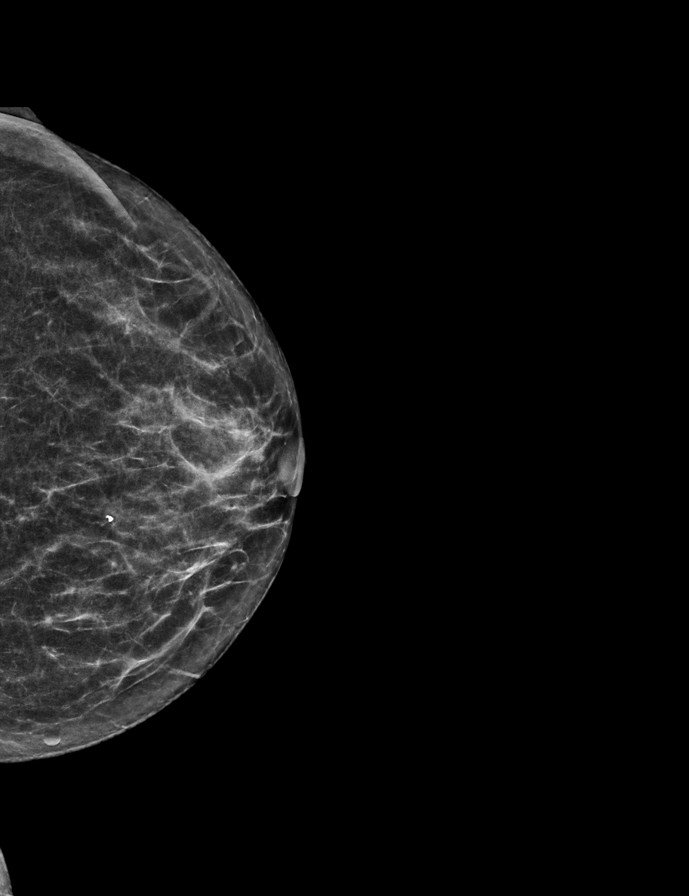

[R MLO synth-2D]
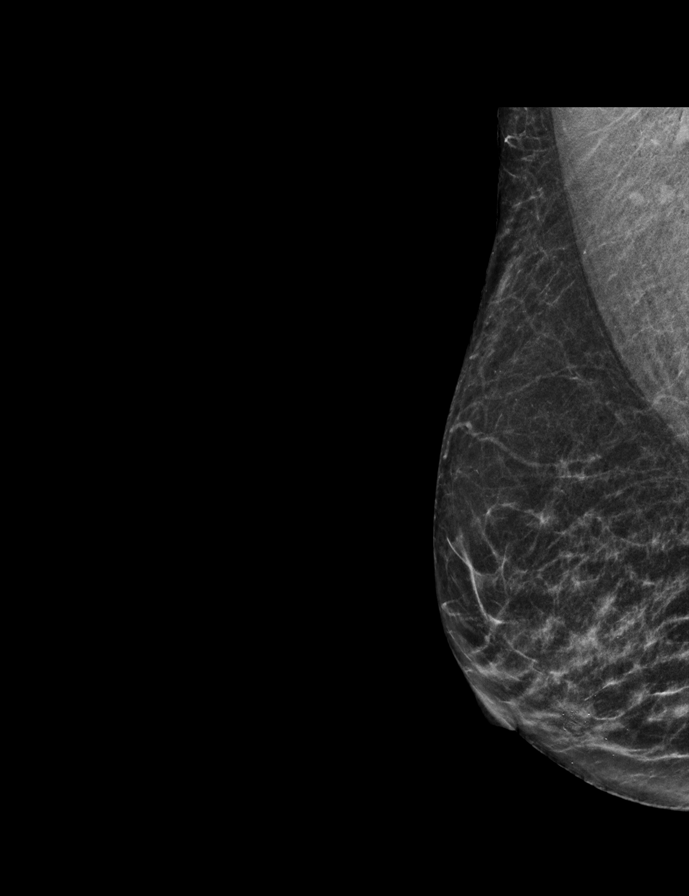

[L MLO synth-2D]
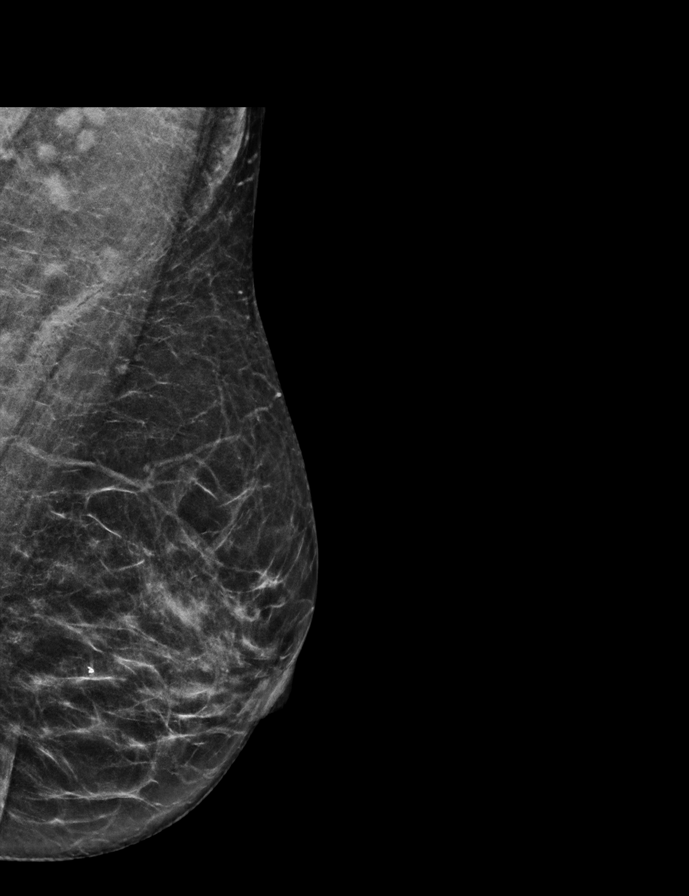

[L CC tomo · 2 of 50 frames shown]
[frame 17/50]
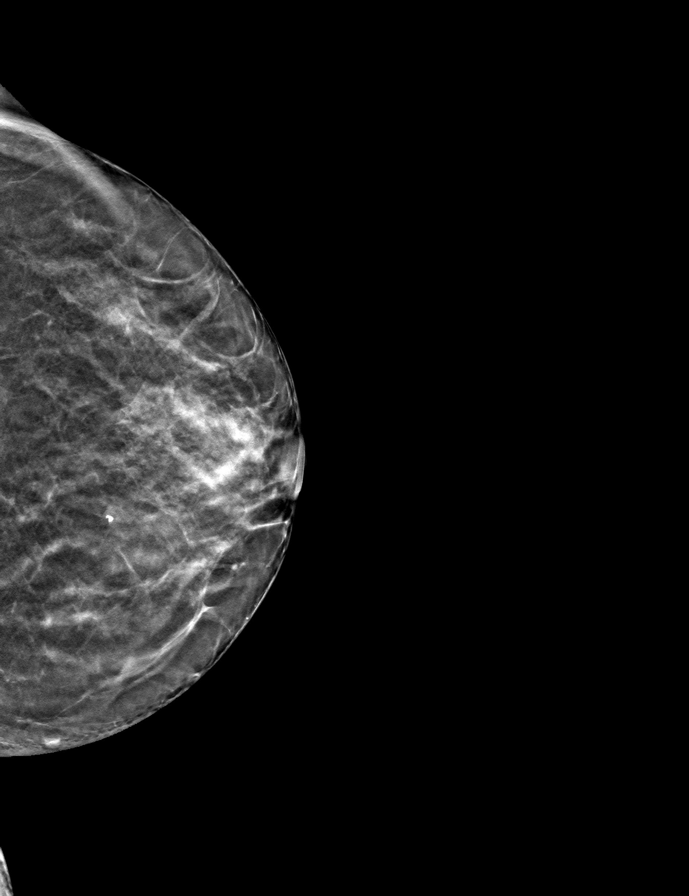
[frame 25/50]
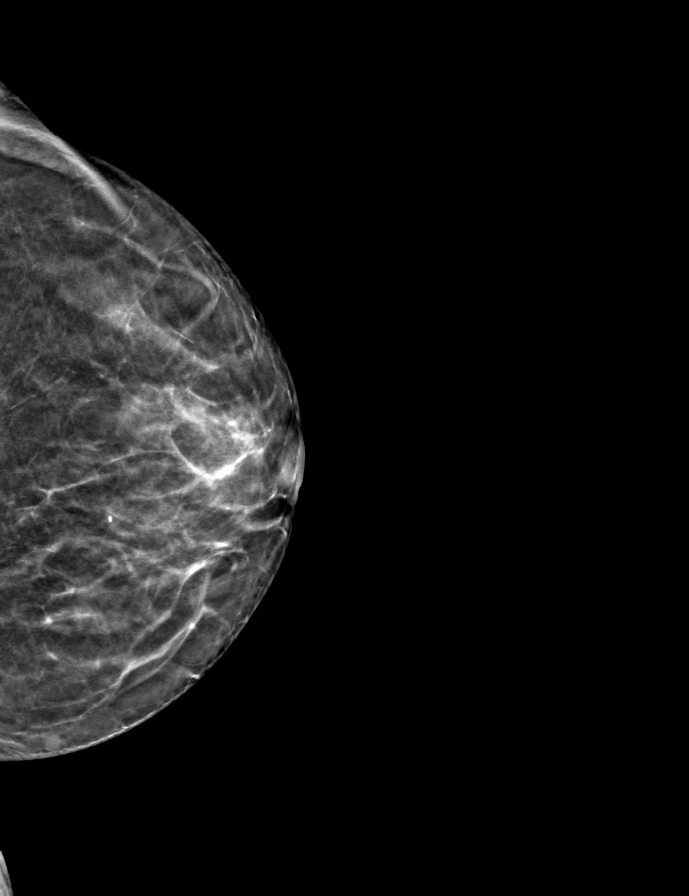

[L MLO tomo · tomo slice 28/55.0]
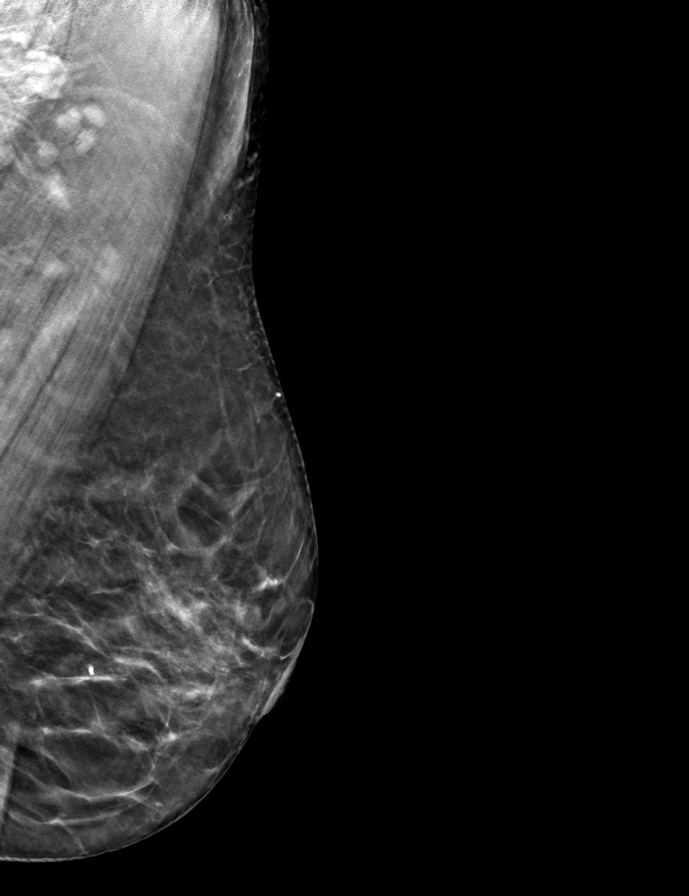

[R MLO tomo · tomo slice 26/51.0]
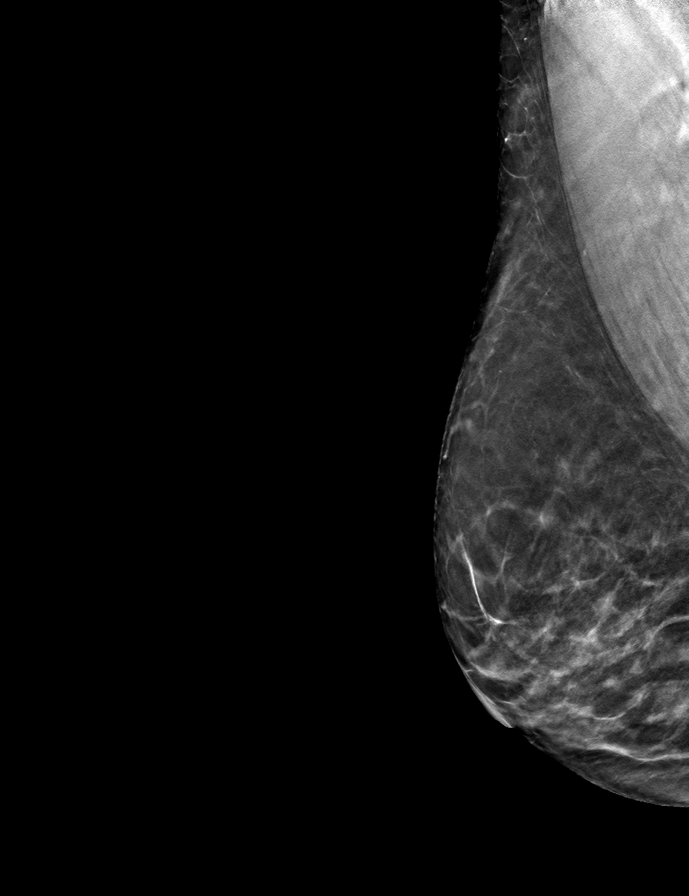

[R CC tomo · tomo slice 25/50.0]
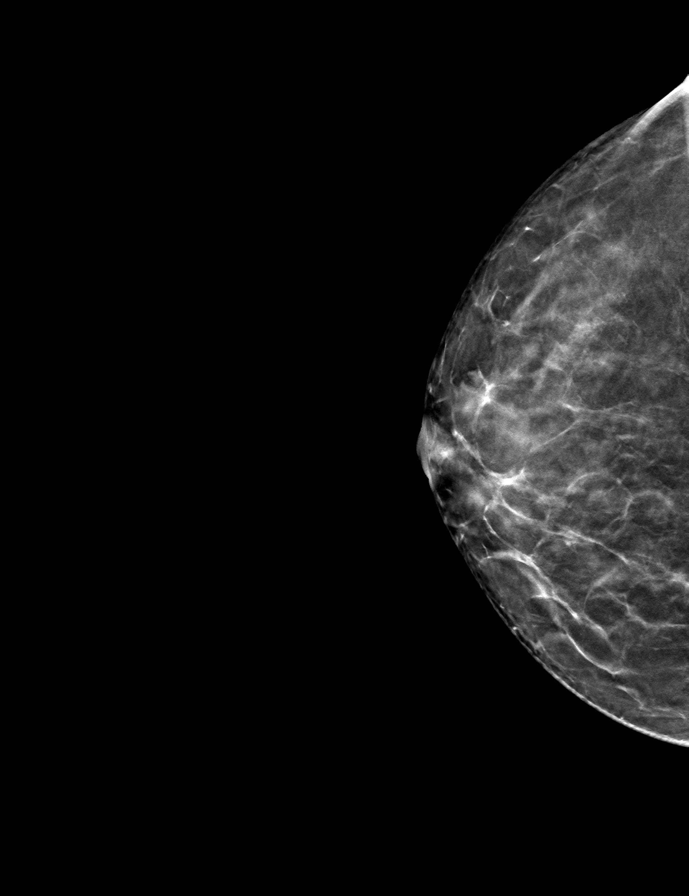

[9 of 24 positions shown; findings below may reference images not displayed]

ACR Breast Density Category c: The breast tissue is heterogeneously
dense, which may obscure small masses.
FINDINGS: There are no findings suspicious for malignancy. Images were
processed with CAD.
IMPRESSION: No mammographic evidence of malignancy. A result letter of this
screening mammogram will be mailed directly to the patient.

RECOMMENDATION:
Screening mammogram in one year. (Code:FT-U-LHB)

BI-RADS CATEGORY  1: Negative.

## 2022-07-31 ENCOUNTER — Encounter: Payer: Self-pay | Admitting: Family Medicine

## 2022-07-31 ENCOUNTER — Ambulatory Visit (INDEPENDENT_AMBULATORY_CARE_PROVIDER_SITE_OTHER): Payer: Medicare Other | Admitting: Family Medicine

## 2022-07-31 VITALS — BP 116/76 | HR 70 | Temp 98.3°F | Ht 63.0 in | Wt 129.9 lb

## 2022-07-31 DIAGNOSIS — R42 Dizziness and giddiness: Secondary | ICD-10-CM

## 2022-07-31 DIAGNOSIS — R7303 Prediabetes: Secondary | ICD-10-CM | POA: Diagnosis not present

## 2022-07-31 DIAGNOSIS — Z91030 Bee allergy status: Secondary | ICD-10-CM

## 2022-07-31 DIAGNOSIS — E119 Type 2 diabetes mellitus without complications: Secondary | ICD-10-CM

## 2022-07-31 DIAGNOSIS — Z23 Encounter for immunization: Secondary | ICD-10-CM

## 2022-07-31 DIAGNOSIS — R55 Syncope and collapse: Secondary | ICD-10-CM

## 2022-07-31 LAB — POCT GLYCOSYLATED HEMOGLOBIN (HGB A1C): Hemoglobin A1C: 5.9 % — AB (ref 4.0–5.6)

## 2022-07-31 MED ORDER — EPINEPHRINE 0.3 MG/0.3ML IJ SOAJ: 0.3000 mg | INTRAMUSCULAR | 1 refills | Status: AC | PRN

## 2022-07-31 NOTE — Progress Notes (Signed)
New Patient Office Visit  Subjective    Patient ID: Emma Hampton, female    DOB: Feb 23, 1956  Age: 66 y.o. MRN: [id number]  CC:  Chief Complaint  Patient presents with   Establish Care    HPI Emma Hampton presents to establish care  Dr. Criss Rosales was the previous provider. Saw her 2-3 weeks ago. States that she was told she had an ear infection and the medication was never called in. States that she was feeling off balance at the time, no fullness, pressure or pain. No fever or chills reported.   States she feels lightheaded sometimes, especially when getting to stand up. No episodes of passing out, no room spinning.   States she just had bloodwork a few weeks ago. I also reviewed the patient's immunization history, she needs a Prevnar 41, she s UTD on her colonoscopy and mammogram. She had a DEXA 7 years ago, we discussed getting another study in follow up which is recommended every 2-4 years. Pt thinks she had one more recently, I will attempt to get record from her previous PCP.    Outpatient Encounter Medications as of 07/31/2022  Medication Sig   ascorbic acid (VITAMIN C) 500 MG tablet Take 500 mg by mouth daily.   atorvastatin (LIPITOR) 20 MG tablet Take 20 mg by mouth at bedtime.   Multiple Vitamin (MULTIVITAMIN) tablet Take 1 tablet by mouth daily.   Potassium Chloride ER 20 MEQ TBCR Take 1 tablet by mouth daily.   [DISCONTINUED] EPINEPHrine 0.3 mg/0.3 mL IJ SOAJ injection Inject 0.3 mg into the muscle as needed for anaphylaxis.   EPINEPHrine 0.3 mg/0.3 mL IJ SOAJ injection Inject 0.3 mg into the muscle as needed for anaphylaxis.   No facility-administered encounter medications on file as of 07/31/2022.    Past Medical History:  Diagnosis Date   Diabetes mellitus without complication ([place])    Fibroids    Retroperitoneal abscess ([facility])     Past Surgical History:  Procedure Laterality Date   BREAST BIOPSY Right    CESAREAN SECTION     CYST EXCISION      TUBAL LIGATION      Family History  Problem Relation Age of Onset   Heart disease Mother        MI   Cancer Daughter        BREAST   LEFT MASECT   Breast cancer Daughter    Diabetes Maternal Uncle    Prostate cancer Maternal Uncle    Heart attack Maternal Grandmother    Dementia Maternal Grandfather    Diabetes Other     Social History   Socioeconomic History   Marital status: Widowed    Spouse name: Not on file   Number of children: Not on file   Years of education: Not on file   Highest education level: Not on file  Occupational History   Not on file  Tobacco Use   Smoking status: Former    Types: Cigarettes    Quit date: 11/03/2015    Years since quitting: 6.7   Smokeless tobacco: Never  Vaping Use   Vaping Use: Former  Substance and Sexual Activity   Alcohol use: Yes    Alcohol/week: 1.0 standard drink of alcohol    Types: 1 Glasses of wine per week   Drug use: No   Sexual activity: Yes    Birth control/protection: Post-menopausal, None  Other Topics Concern   Not on file  Social History Narrative  Not on file   Social Determinants of Health   Financial Resource Strain: Not on file  Food Insecurity: Not on file  Transportation Needs: Not on file  Physical Activity: Not on file  Stress: Not on file  Social Connections: Not on file  Intimate Partner Violence: Not on file    Review of Systems  Constitutional:  Negative for chills and fever.  HENT:  Negative for ear discharge, ear pain, hearing loss and tinnitus.   Eyes:  Negative for blurred vision and double vision.  Respiratory:  Negative for shortness of breath.   Cardiovascular:  Negative for palpitations and leg swelling.  Neurological:  Positive for dizziness. Negative for weakness and headaches.  All other systems reviewed and are negative.       Objective    BP 116/76 (BP Location: Left Arm, Patient Position: Sitting, Cuff Size: Normal)   Pulse 70   Temp 98.3 F (36.8 C) (Oral)    Ht '5\' 3"'$  (1.6 m)   Wt 129 lb 14.4 oz (58.9 kg)   SpO2 98%   BMI 23.01 kg/m   Physical Exam Vitals reviewed.  Constitutional:      Appearance: Normal appearance. She is well-groomed and normal weight.  HENT:     Head: Normocephalic and atraumatic.     Mouth/Throat:     Mouth: Mucous membranes are moist.     Pharynx: Oropharynx is clear.  Eyes:     Extraocular Movements: Extraocular movements intact.     Conjunctiva/sclera: Conjunctivae normal.     Pupils: Pupils are equal, round, and reactive to light.  Cardiovascular:     Rate and Rhythm: Normal rate and regular rhythm.     Pulses: Normal pulses.     Heart sounds: S1 normal and S2 normal.  Pulmonary:     Effort: Pulmonary effort is normal.     Breath sounds: Normal breath sounds and air entry.  Abdominal:     General: Abdomen is flat. Bowel sounds are normal.     Palpations: Abdomen is soft.  Musculoskeletal:        General: Normal range of motion.     Cervical back: Normal range of motion and neck supple.     Right lower leg: No edema.     Left lower leg: No edema.  Skin:    General: Skin is warm and dry.  Neurological:     Mental Status: She is alert and oriented to person, place, and time. Mental status is at baseline.     Gait: Gait is intact.  Psychiatric:        Mood and Affect: Mood and affect normal.        Speech: Speech normal.        Behavior: Behavior normal.        Judgment: Judgment normal.     Orthostatic VS for the past 24 hrs (Last 3 readings):  BP- Lying Pulse- Lying BP- Sitting Pulse- Sitting BP- Standing at 0 minutes Pulse- Standing at 0 minutes  07/31/22 1551 120/80 57 112/60 70 110/60 70       Assessment & Plan:   Problem List Items Addressed This Visit       Cardiovascular and Mediastinum   Postural dizziness with presyncope    Orthostatic blood pressures performed in office were negative however there was a 10 mmHg drop overall. I recommended increasing fluid intake, adding a small  amount of salt to her diet and using compression stockings during the day to support  blood pressure. Patient was instructed if her symptoms did not improve to call the office and I will send to cardiology for a full work up. Physical exam essentially benign today, there was no evidence of any ear infection or effusion.      Relevant Medications   EPINEPHrine 0.3 mg/0.3 mL IJ SOAJ injection     Other   Allergy to bee sting (Chronic)    History of, her epipen will expire in Jan 2024, I sent in new refills for her.       Relevant Medications   EPINEPHrine 0.3 mg/0.3 mL IJ SOAJ injection   Prediabetes - Primary (Chronic)    A1C performed in office today and was 5.9. We discussed lowering sugar and starches in her diet, pt admits the she eat a lot of sugar throughout the day including raisinettes and ice cream. We talked about looking for sugar free items and eating more green veggies as well.       Relevant Orders   POC HgB A1c (Completed)   Other Visit Diagnoses     Immunization due       Relevant Orders   Pneumococcal conjugate vaccine 20-valent (Prevnar 20)       Return in about 3 months (around 10/31/2022) for well woman and pap smear.   Farrel Conners, MD

## 2022-07-31 NOTE — Patient Instructions (Addendum)
Look for products that say "sugar free" or "Zero sugar"   Your blood pressure did drop about 10 points when we stood you up, I advise increasing salt in your diet, increase water intake and you may try wearing compression stockings to help support your blood pressure.

## 2022-08-01 DIAGNOSIS — R7303 Prediabetes: Secondary | ICD-10-CM | POA: Insufficient documentation

## 2022-08-01 NOTE — Assessment & Plan Note (Signed)
History of, her epipen will expire in Jan 2024, I sent in new refills for her.

## 2022-08-01 NOTE — Assessment & Plan Note (Signed)
A1C performed in office today and was 5.9. We discussed lowering sugar and starches in her diet, pt admits the she eat a lot of sugar throughout the day including raisinettes and ice cream. We talked about looking for sugar free items and eating more green veggies as well.

## 2022-08-01 NOTE — Assessment & Plan Note (Signed)
Orthostatic blood pressures performed in office were negative however there was a 10 mmHg drop overall. I recommended increasing fluid intake, adding a small amount of salt to her diet and using compression stockings during the day to support blood pressure. Patient was instructed if her symptoms did not improve to call the office and I will send to cardiology for a full work up. Physical exam essentially benign today, there was no evidence of any ear infection or effusion.

## 2022-08-07 ENCOUNTER — Telehealth: Payer: Self-pay | Admitting: Family Medicine

## 2022-08-07 NOTE — Telephone Encounter (Signed)
I called the patient for more information as the patient only had a POC HgA1C when she was here in the office.  Patient stated she told Dr Legrand Como she had labs at Dr Fransico Setters office last week and she could review these prior to ordering additional labs.  Patient stated she did not sign a medical records form when she was here to have the labs test results sent to our office.  Patient is aware a medical records release form was mailed to her home address to return to our office.

## 2022-08-07 NOTE — Telephone Encounter (Signed)
Pt is asking that CMA call the [facility]  To request her blood work  Please call them at: [phone]

## 2022-08-07 NOTE — Telephone Encounter (Signed)
Pt called to FU on blood work  Pt would like to know if MD has received her results yet?  Pt would like a call back. [phone] (Cell)

## 2022-08-08 NOTE — Telephone Encounter (Signed)
Spoke with Jeani Hawking at Dr Fransico Setters office to request records.  Jeani Hawking stated the patient needs to sign a medical records release form at their office or here prior to records being sent.  I left a detailed message with this information at the patient's cell number and the patient was previously informed of this in a prior phone note.

## 2022-08-16 ENCOUNTER — Other Ambulatory Visit: Payer: Self-pay | Admitting: Family Medicine

## 2022-09-18 ENCOUNTER — Other Ambulatory Visit: Payer: Self-pay

## 2022-09-18 ENCOUNTER — Encounter (HOSPITAL_BASED_OUTPATIENT_CLINIC_OR_DEPARTMENT_OTHER): Payer: Self-pay

## 2022-09-18 ENCOUNTER — Encounter (HOSPITAL_COMMUNITY): Payer: Self-pay | Admitting: Emergency Medicine

## 2022-09-18 ENCOUNTER — Emergency Department (HOSPITAL_COMMUNITY)
Admission: EM | Admit: 2022-09-18 | Discharge: 2022-09-18 | Disposition: A | Discharge status: Home / Self Care | Payer: Medicare Other | Attending: Emergency Medicine | Admitting: Emergency Medicine

## 2022-09-18 DIAGNOSIS — R61 Generalized hyperhidrosis: Secondary | ICD-10-CM | POA: Insufficient documentation

## 2022-09-18 DIAGNOSIS — Z20822 Contact with and (suspected) exposure to covid-19: Secondary | ICD-10-CM | POA: Insufficient documentation

## 2022-09-18 DIAGNOSIS — J0130 Acute sphenoidal sinusitis, unspecified: Secondary | ICD-10-CM | POA: Insufficient documentation

## 2022-09-18 DIAGNOSIS — R55 Syncope and collapse: Secondary | ICD-10-CM | POA: Insufficient documentation

## 2022-09-18 DIAGNOSIS — R112 Nausea with vomiting, unspecified: Secondary | ICD-10-CM | POA: Insufficient documentation

## 2022-09-18 DIAGNOSIS — E876 Hypokalemia: Secondary | ICD-10-CM | POA: Insufficient documentation

## 2022-09-18 LAB — URINALYSIS, ROUTINE W REFLEX MICROSCOPIC
Bilirubin Urine: NEGATIVE
Glucose, UA: NEGATIVE mg/dL
Hgb urine dipstick: NEGATIVE
Ketones, ur: 20 mg/dL — AB
Leukocytes,Ua: NEGATIVE
Nitrite: NEGATIVE
Protein, ur: NEGATIVE mg/dL
Specific Gravity, Urine: 1.015 (ref 1.005–1.030)
pH: 5 (ref 5.0–8.0)

## 2022-09-18 LAB — CBC WITH DIFFERENTIAL/PLATELET
Abs Immature Granulocytes: 0.02 10*3/uL (ref 0.00–0.07)
Basophils Absolute: 0 10*3/uL (ref 0.0–0.1)
Basophils Relative: 0 %
Eosinophils Absolute: 0.1 10*3/uL (ref 0.0–0.5)
Eosinophils Relative: 1 %
HCT: 39.5 % (ref 36.0–46.0)
Hemoglobin: 12.4 g/dL (ref 12.0–15.0)
Immature Granulocytes: 0 %
Lymphocytes Relative: 23 %
Lymphs Abs: 2.1 10*3/uL (ref 0.7–4.0)
MCH: 30.4 pg (ref 26.0–34.0)
MCHC: 31.4 g/dL (ref 30.0–36.0)
MCV: 96.8 fL (ref 80.0–100.0)
Monocytes Absolute: 0.6 10*3/uL (ref 0.1–1.0)
Monocytes Relative: 6 %
Neutro Abs: 6.5 10*3/uL (ref 1.7–7.7)
Neutrophils Relative %: 70 %
Platelets: 217 10*3/uL (ref 150–400)
RBC: 4.08 MIL/uL (ref 3.87–5.11)
RDW: 14.9 % (ref 11.5–15.5)
WBC: 9.3 10*3/uL (ref 4.0–10.5)
nRBC: 0 % (ref 0.0–0.2)

## 2022-09-18 LAB — RESP PANEL BY RT-PCR (FLU A&B, COVID) ARPGX2
Influenza A by PCR: NEGATIVE
Influenza B by PCR: NEGATIVE
SARS Coronavirus 2 by RT PCR: NEGATIVE

## 2022-09-18 LAB — CBG MONITORING, ED: Glucose-Capillary: 124 mg/dL — ABNORMAL HIGH (ref 70–99)

## 2022-09-18 LAB — COMPREHENSIVE METABOLIC PANEL
ALT: 24 U/L (ref 0–44)
AST: 33 U/L (ref 15–41)
Albumin: 3.9 g/dL (ref 3.5–5.0)
Alkaline Phosphatase: 72 U/L (ref 38–126)
Anion gap: 13 (ref 5–15)
BUN: 13 mg/dL (ref 8–23)
CO2: 20 mmol/L — ABNORMAL LOW (ref 22–32)
Calcium: 9.8 mg/dL (ref 8.9–10.3)
Chloride: 106 mmol/L (ref 98–111)
Creatinine, Ser: 0.95 mg/dL (ref 0.44–1.00)
GFR, Estimated: >60 mL/min (ref >60)
Glucose, Bld: 142 mg/dL — ABNORMAL HIGH (ref 70–99)
Potassium: 3.4 mmol/L — ABNORMAL LOW (ref 3.5–5.1)
Sodium: 139 mmol/L (ref 135–145)
Total Bilirubin: 0.6 mg/dL (ref 0.3–1.2)
Total Protein: 6.4 g/dL — ABNORMAL LOW (ref 6.5–8.1)

## 2022-09-18 LAB — LIPASE, BLOOD: Lipase: 24 U/L (ref 11–51)

## 2022-09-18 NOTE — ED Provider Triage Note (Signed)
Emergency Medicine Provider Triage Evaluation Note  Emma Hampton , a 66 y.o. female  was evaluated in triage.  Pt complains of diarrhea, nausea, and vomiting since earlier tonight.  She has associated dizziness, dry mouth, and feelings of presyncope. Denies abdominal pain, chest pain, shortness of breath, fevers, chills  Review of Systems  Positive:  Negative:   Physical Exam  There were no vitals taken for this visit. Gen:   Awake, no distress   Resp:  Normal effort  MSK:   Moves extremities without difficulty  Other:  Abdomen is soft and nontender  Medical Decision Making  Medically screening exam initiated at 9:25 PM.  Appropriate orders placed.  Emma Hampton was informed that the remainder of the evaluation will be completed by another provider, this initial triage assessment does not replace that evaluation, and the importance of remaining in the ED until their evaluation is complete.     Adolphus Birchwood, [place] 09/18/22 2126

## 2022-09-18 NOTE — ED Triage Notes (Signed)
Patient arrived with EMS from work reports near syncopal episode this evening with lightheadedness and emesis with diarrhea .

## 2022-09-18 NOTE — ED Notes (Signed)
Registration left this patient labels on this writers desk with a note that states patient left d/t wait time

## 2022-09-18 NOTE — ED Triage Notes (Signed)
Eloped from [facility] ED after arriving EMS.   Sts she has been working all day and got overheated. Nausea, diarrhea, lightheadedness and near syncopal episode.

## 2022-09-19 ENCOUNTER — Emergency Department (HOSPITAL_BASED_OUTPATIENT_CLINIC_OR_DEPARTMENT_OTHER)
Admission: EM | Admit: 2022-09-19 | Discharge: 2022-09-19 | Disposition: A | Discharge status: Home / Self Care | Payer: Medicare Other | Source: Home / Self Care | Attending: Emergency Medicine | Admitting: Emergency Medicine

## 2022-09-19 ENCOUNTER — Emergency Department (HOSPITAL_BASED_OUTPATIENT_CLINIC_OR_DEPARTMENT_OTHER): Payer: Medicare Other

## 2022-09-19 DIAGNOSIS — R55 Syncope and collapse: Secondary | ICD-10-CM

## 2022-09-19 DIAGNOSIS — R112 Nausea with vomiting, unspecified: Secondary | ICD-10-CM

## 2022-09-19 DIAGNOSIS — J0130 Acute sphenoidal sinusitis, unspecified: Secondary | ICD-10-CM

## 2022-09-19 MED ORDER — ONDANSETRON 4 MG PO TBDP: 4.0000 mg | ORAL_TABLET | Freq: Three times a day (TID) | ORAL | 0 refills | Status: DC | PRN

## 2022-09-19 MED ORDER — DOXYCYCLINE HYCLATE 100 MG PO CAPS: 100.0000 mg | ORAL_CAPSULE | Freq: Two times a day (BID) | ORAL | 0 refills | Status: DC

## 2022-09-19 NOTE — ED Notes (Signed)
Pt escorted to and from hall bathroom via w/c - no acute changes/distress noted.  Per provider, pt to d/c home if able to tolerate PO intake (has been given clear soft drink, bag popcorn, peanut butter crackers, trail mix bar to choose from)

## 2022-09-19 NOTE — ED Notes (Signed)
Patient transported to CT via stretcher by [employer]
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
## 2022-09-19 NOTE — ED Notes (Signed)
Pt agreeable with d/c plan as discussed by provider- this nurse has verbally reinforced d/c instructions and provided pt with written copy - pt acknowledges verbal understanding and denies any addl questions concerns needs - able to tolerate sips of ginger ale and small amount of popcorn

## 2022-09-19 NOTE — ED Notes (Signed)
Late entry -- This nurse observed pt sitting up in bed - awake and alert lying in bed; no obvious distress.  Pt placed on continuous cardiac and pulse ox monitoring by ED tech.  Will monitor for acute changes and maintain plan of care.

## 2022-09-23 ENCOUNTER — Telehealth: Payer: Self-pay | Admitting: *Deleted

## 2022-09-24 ENCOUNTER — Ambulatory Visit (INDEPENDENT_AMBULATORY_CARE_PROVIDER_SITE_OTHER): Payer: Medicare Other | Admitting: Family Medicine

## 2022-09-24 ENCOUNTER — Encounter: Payer: Self-pay | Admitting: Family Medicine

## 2022-09-24 VITALS — BP 110/70 | HR 80 | Temp 98.3°F | Ht 63.0 in | Wt 127.3 lb

## 2022-09-24 DIAGNOSIS — R55 Syncope and collapse: Secondary | ICD-10-CM | POA: Diagnosis not present

## 2022-09-24 DIAGNOSIS — R42 Dizziness and giddiness: Secondary | ICD-10-CM | POA: Diagnosis not present

## 2022-09-24 NOTE — Patient Instructions (Addendum)
Vasovagal Syncope-- vagal nerve dysfunction  Try carotid massage if you feel an event coming on, wearing compression stockings, increasing salt and water intake.

## 2022-09-24 NOTE — Assessment & Plan Note (Signed)
Most likely Vasovagal syncope/ dysfunction, we discussed increasing salt and fluids, also I advised she start wearing compression stockings. If she continues to have these episodes we may send to cardiology or neurology for further recommendations.

## 2022-09-24 NOTE — Progress Notes (Signed)
Established Patient Office Visit  Subjective   Patient ID: Emma Hampton, female    DOB: [birth year]  Age: 66 y.o. MRN: [id number]  Chief Complaint  Patient presents with   Follow-up    Patient presents for ER follow up   Mass    Patient complains of an ingrown hair noted along the right vaginal area x1 month    Pt is here for ER follow up and a new problem. She went to the ED on 09/18/22 after sustaining a syncopal event. She had a full work up and was diagnosed with acute sinusitis, given zofran and antibiotics for the infection. She reports she has not had any further episodes since the ER visit. She reports she hadn't eaten that day except an early morning breakfast, she thought that might have been the cause. She reports no fever/chills, no sinus pressure or congestion. She states this isn't the first time she has passed out, states that she has done this before in the past. She was sweating and became very nauseated.   She is also reporting an ingrown hair on the right side of the vaginal area, she reports it is not red, inflamed or painful, she is using her scrubs to try and loosen the hair.    Current Outpatient Medications  Medication Instructions   ascorbic acid (VITAMIN C) 500 mg, Oral, Daily   atorvastatin (LIPITOR) 20 mg, Oral, Daily at bedtime   doxycycline (VIBRAMYCIN) 100 mg, Oral, 2 times daily   EPINEPHrine (EPI-PEN) 0.3 mg, Intramuscular, As needed   Multiple Vitamin (MULTIVITAMIN) tablet 1 tablet, Daily   ondansetron (ZOFRAN-ODT) 4 mg, Oral, Every 8 hours PRN   Potassium Chloride ER 20 MEQ TBCR 1 tablet, Oral, Daily     Patient Active Problem List   Diagnosis Date Noted   Prediabetes 08/01/2022   Allergy to bee sting 07/31/2022   Postural dizziness with presyncope 07/31/2022   Meningitis, viral 09/10/2013      Review of Systems  All other systems reviewed and are negative.     Objective:     BP 110/70 (BP Location: Left Arm, Patient  Position: Sitting, Cuff Size: Normal)   Pulse 80   Temp 98.3 F (36.8 C) (Oral)   Ht '5\' 3"'$  (1.6 m)   Wt 127 lb 4.8 oz (57.7 kg)   SpO2 97%   BMI 22.55 kg/m    Physical Exam Vitals reviewed.  Constitutional:      Appearance: Normal appearance. She is well-groomed and normal weight.  Eyes:     Conjunctiva/sclera: Conjunctivae normal.  Cardiovascular:     Heart sounds: S1 normal and S2 normal.  Pulmonary:     Effort: Pulmonary effort is normal.  Musculoskeletal:        General: Normal range of motion.     Right lower leg: No edema.     Left lower leg: No edema.  Skin:    General: Skin is warm and dry.     Comments: There is a small ingrown hair on the right side of the external labia majora, there is no surrounding erythema, edema, there is no tenderness to palpation.  Neurological:     Mental Status: She is alert and oriented to person, place, and time. Mental status is at baseline.     Gait: Gait is intact.  Psychiatric:        Mood and Affect: Mood and affect normal.        Speech: Speech normal.  Behavior: Behavior normal.        Judgment: Judgment normal.      No results found for any visits on 09/24/22.  Last metabolic panel Lab Results  Component Value Date   GLUCOSE 142 (H) 09/18/2022   NA 139 09/18/2022   K 3.4 (L) 09/18/2022   CL 106 09/18/2022   CO2 20 (L) 09/18/2022   BUN 13 09/18/2022   CREATININE 0.95 09/18/2022   GFRNONAA >60 09/18/2022   CALCIUM 9.8 09/18/2022   PROT 6.4 (L) 09/18/2022   ALBUMIN 3.9 09/18/2022   BILITOT 0.6 09/18/2022   ALKPHOS 72 09/18/2022   AST 33 09/18/2022   ALT 24 09/18/2022   ANIONGAP 13 09/18/2022      The ASCVD Risk score (Arnett DK, et al., 2019) failed to calculate for the following reasons:   Cannot find a previous HDL lab   Cannot find a previous total cholesterol lab    Assessment & Plan:   Problem List Items Addressed This Visit       Cardiovascular and Mediastinum   Postural dizziness with  presyncope - Primary    Most likely Vasovagal syncope/ dysfunction, we discussed increasing salt and fluids, also I advised she start wearing compression stockings. If she continues to have these episodes we may send to cardiology or neurology for further recommendations.      Ingrown hair-- not inflamed, recommended she continue with the scrubs, I will check it again next month at her well woman appt. No follow-ups on file.    Farrel Conners, MD

## 2022-10-31 ENCOUNTER — Encounter: Payer: Medicare Other | Admitting: Family Medicine

## 2022-11-03 ENCOUNTER — Encounter: Payer: Self-pay | Admitting: Family Medicine

## 2022-11-03 ENCOUNTER — Ambulatory Visit (INDEPENDENT_AMBULATORY_CARE_PROVIDER_SITE_OTHER): Payer: Medicare Other | Admitting: Family Medicine

## 2022-11-03 ENCOUNTER — Other Ambulatory Visit (HOSPITAL_COMMUNITY)
Admission: RE | Admit: 2022-11-03 | Discharge: 2022-11-03 | Disposition: A | Discharge status: Home / Self Care | Payer: Medicare Other | Source: Ambulatory Visit | Attending: Family Medicine | Admitting: Family Medicine

## 2022-11-03 VITALS — BP 112/80 | HR 65 | Temp 98.9°F | Ht 64.0 in | Wt 128.2 lb

## 2022-11-03 DIAGNOSIS — Z01419 Encounter for gynecological examination (general) (routine) without abnormal findings: Secondary | ICD-10-CM | POA: Insufficient documentation

## 2022-11-03 DIAGNOSIS — Z1151 Encounter for screening for human papillomavirus (HPV): Secondary | ICD-10-CM | POA: Insufficient documentation

## 2022-11-03 DIAGNOSIS — N941 Unspecified dyspareunia: Secondary | ICD-10-CM | POA: Diagnosis not present

## 2022-11-03 DIAGNOSIS — Z124 Encounter for screening for malignant neoplasm of cervix: Secondary | ICD-10-CM

## 2022-11-03 MED ORDER — ESTRADIOL 0.1 MG/GM VA CREA: 1.0000 | TOPICAL_CREAM | VAGINAL | 1 refills | Status: DC

## 2022-11-03 NOTE — Progress Notes (Signed)
Established Patient Office Visit  Subjective   Patient ID: Emma Hampton, female    DOB: [birth year]  Age: 66 y.o. MRN: [id number]  Chief Complaint  Patient presents with   Annual Exam    Patient is here for her pap smear. She reports she has never had an abnormal pap. States that she is having increased vaginal dryness especially with intercourse. States that she has tried OTC creams and lubricants without improvement. States it takes her 3 days to recover after having intercourse. States that it is uncomfortable, no bleeding with intercourse, no fever/chills, no pelvic pain.    Current Outpatient Medications  Medication Instructions   ascorbic acid (VITAMIN C) 500 mg, Oral, Daily   atorvastatin (LIPITOR) 20 mg, Oral, Daily at bedtime   EPINEPHrine (EPI-PEN) 0.3 mg, Intramuscular, As needed   estradiol (ESTRACE VAGINAL) 0.1 MG/GM vaginal cream 1 Applicatorful, Vaginal, 3 times weekly   Multiple Vitamin (MULTIVITAMIN) tablet 1 tablet, Daily   Potassium Chloride ER 20 MEQ TBCR 1 tablet, Oral, Daily    Patient Active Problem List   Diagnosis Date Noted   Prediabetes 08/01/2022   Allergy to bee sting 07/31/2022   Postural dizziness with presyncope 07/31/2022   Meningitis, viral 09/10/2013      Review of Systems  All other systems reviewed and are negative.     Objective:     BP 112/80 (BP Location: Left Arm, Patient Position: Sitting, Cuff Size: Normal)   Pulse 65   Temp 98.9 F (37.2 C) (Oral)   Ht '5\' 4"'$  (1.626 m)   Wt 128 lb 3.2 oz (58.2 kg)   SpO2 98%   BMI 22.01 kg/m    Physical Exam Vitals reviewed. Exam conducted with a chaperone present.  Constitutional:      Appearance: She is normal weight.  Pulmonary:     Effort: Pulmonary effort is normal.  Genitourinary:    General: Normal vulva.     Exam position: Lithotomy position.     Tanner stage (genital): 5.     Vagina: Normal.     Cervix: Normal.     Uterus: Normal.      Adnexa: Right adnexa  normal and left adnexa normal.     Rectum: Normal.  Neurological:     General: No focal deficit present.     Mental Status: She is alert and oriented to person, place, and time.  Psychiatric:        Mood and Affect: Mood and affect normal.      No results found for any visits on 11/03/22.    The ASCVD Risk score (Arnett DK, et al., 2019) failed to calculate for the following reasons:   Cannot find a previous HDL lab   Cannot find a previous total cholesterol lab    Assessment & Plan:   Problem List Items Addressed This Visit   None Visit Diagnoses     Encounter for Papanicolaou smear for cervical cancer screening    -  Primary   Relevant Orders   Cytology - PAP   Dyspareunia in female       Relevant Medications   Patient reports significant dryness of the tissue, states that it takes her 3 days to recover from intercourse. OTC creams and lubricants are not effective, will treat with estradiol cream three times per week to be applied vaginally to help improve natural lubrication and better integrity of the tissue.   estradiol (ESTRACE VAGINAL) 0.1 MG/GM vaginal cream  Return in about 6 months (around 05/04/2023) for follow up prediabetes.    Farrel Conners, MD

## 2022-11-05 LAB — CYTOLOGY - PAP
Chlamydia: NEGATIVE
Comment: NEGATIVE
Comment: NEGATIVE
Comment: NEGATIVE
Comment: NORMAL
Diagnosis: NEGATIVE
High risk HPV: NEGATIVE
Neisseria Gonorrhea: NEGATIVE
Trichomonas: NEGATIVE

## 2022-11-06 NOTE — Progress Notes (Signed)
Normal pap smear. Patient will not need any further pap smears unless requested.

## 2022-11-26 ENCOUNTER — Telehealth: Payer: Self-pay | Admitting: Family Medicine

## 2022-11-26 NOTE — Telephone Encounter (Signed)
Left message for patient to call back and schedule Medicare Annual Wellness Visit (AWV) either virtually or in office. Left  my Emma Hampton number [phone]   awvi 12/22/21 per [facility]  please schedule with Nurse Health Adviser   45 min for awv-i and in office appointments 30 min for awv-s  phone/virtual appointments

## 2022-11-26 NOTE — Telephone Encounter (Signed)
Pt stated the cream that was prescribed to her estradiol (ESTRACE VAGINAL) 0.1 MG/GM vaginal cream stated the side effects was possibly losing hair so she would like to switch to Reverr'e which is a suppository and wanted to see if that will work the same as the cream.  Additionally, she would like to know if she is able to have oral sex with the new med she would like to switch to?  Please advise

## 2022-11-27 NOTE — Telephone Encounter (Signed)
Spoke with the patient and informed her of the message below.  Patient stated she looked at [facility], found "Reverie cream and suppositories" and wanted to know Dr Norva Riffle opinion on using this OTC.  Message sent to PCP.

## 2022-11-27 NOTE — Telephone Encounter (Signed)
Ok does she mean revaree cream? This is not the same as the estrace.  Revaree does not have any estrogen in it, but if she wants to try that first that's fine with me. Please let me know if that is the cream she is referring to

## 2022-11-28 NOTE — Telephone Encounter (Signed)
Left a detailed message at the patient's home number with the information below. ?

## 2022-11-28 NOTE — Telephone Encounter (Signed)
I think it's a good first line cream to use-- let me know if it works for you!

## 2022-12-04 ENCOUNTER — Encounter: Payer: Self-pay | Admitting: Family Medicine

## 2022-12-04 ENCOUNTER — Ambulatory Visit (INDEPENDENT_AMBULATORY_CARE_PROVIDER_SITE_OTHER): Payer: Medicare Other | Admitting: Family Medicine

## 2022-12-04 ENCOUNTER — Ambulatory Visit (INDEPENDENT_AMBULATORY_CARE_PROVIDER_SITE_OTHER): Payer: Medicare Other

## 2022-12-04 VITALS — BP 120/70 | HR 70 | Temp 98.3°F | Ht 64.0 in | Wt 130.2 lb

## 2022-12-04 DIAGNOSIS — M545 Low back pain, unspecified: Secondary | ICD-10-CM

## 2022-12-04 MED ORDER — CYCLOBENZAPRINE HCL 10 MG PO TABS: 5.0000 mg | ORAL_TABLET | Freq: Three times a day (TID) | ORAL | 0 refills | Status: DC | PRN
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
## 2022-12-08 ENCOUNTER — Other Ambulatory Visit: Payer: Self-pay | Admitting: Family Medicine

## 2022-12-08 DIAGNOSIS — Z1231 Encounter for screening mammogram for malignant neoplasm of breast: Secondary | ICD-10-CM

## 2022-12-10 ENCOUNTER — Telehealth: Payer: Self-pay | Admitting: Family Medicine

## 2022-12-10 NOTE — Telephone Encounter (Signed)
Pt called, returning CMA's call. CMA was unavailable. Pt asked that CMA call back at their earliest convenience. 

## 2022-12-11 NOTE — Telephone Encounter (Signed)
See results note. 

## 2023-01-16 ENCOUNTER — Telehealth: Payer: Self-pay | Admitting: Family Medicine

## 2023-01-16 NOTE — Telephone Encounter (Signed)
Left message for patient to call back and schedule Medicare Annual Wellness Visit (AWV) either virtually or in office. Left  my Emma Hampton number [phone]    awvi 12/22/21 per [facility] please schedule with Nurse Health Adviser   45 min for awv-i  in office appointments 30 min for awv-s & awv-i phone/virtual appointments

## 2023-02-04 ENCOUNTER — Ambulatory Visit
Admission: RE | Admit: 2023-02-04 | Discharge: 2023-02-04 | Disposition: A | Discharge status: Home / Self Care | Payer: Medicare Other | Source: Ambulatory Visit | Attending: Family Medicine | Admitting: Family Medicine

## 2023-02-04 DIAGNOSIS — Z1231 Encounter for screening mammogram for malignant neoplasm of breast: Secondary | ICD-10-CM

## 2023-02-05 NOTE — Progress Notes (Signed)
Normal mammogram, repeat yearly.

## 2023-02-10 ENCOUNTER — Other Ambulatory Visit: Payer: Self-pay | Admitting: Family Medicine

## 2023-03-05 ENCOUNTER — Ambulatory Visit (INDEPENDENT_AMBULATORY_CARE_PROVIDER_SITE_OTHER): Payer: Medicare Other | Admitting: Family Medicine

## 2023-03-05 ENCOUNTER — Encounter: Payer: Self-pay | Admitting: Family Medicine

## 2023-03-05 VITALS — BP 124/80 | Temp 98.1°F | Wt 130.4 lb

## 2023-03-05 DIAGNOSIS — M4826 Kissing spine, lumbar region: Secondary | ICD-10-CM | POA: Diagnosis not present

## 2023-03-05 DIAGNOSIS — M79604 Pain in right leg: Secondary | ICD-10-CM | POA: Diagnosis not present

## 2023-03-05 DIAGNOSIS — M545 Low back pain, unspecified: Secondary | ICD-10-CM | POA: Diagnosis not present

## 2023-03-05 MED ORDER — METHYLPREDNISOLONE 4 MG PO TBPK: ORAL_TABLET | ORAL | 0 refills | Status: DC

## 2023-03-05 NOTE — Progress Notes (Signed)
Acute Office Visit  Subjective:     Patient ID: Emma Hampton, female    DOB: 1956/12/13, 67 y.o.   MRN: [id number]  Chief Complaint  Patient presents with   Leg Pain    Right, climbing stair, on set Monday     HPI Patient is in today for new problem, pt reports that 4 days ago she started having problems walking, states her right leg started having severe pain up into the right hip, states that it happens with weight bearing. States she can go down the steps but not up the steps.  States that this has happened in the past but it was more characterized as a numbness/ weakness of the flexor muscle of the right leg. States she sometimes feels like the leg is going to give out on her when she steps down on the leg.   Review of Systems  All other systems reviewed and are negative.       Objective:    BP 124/80 (BP Location: Left Arm, Patient Position: Sitting, Cuff Size: Normal)   Temp 98.1 F (36.7 C) (Oral)   Wt 130 lb 6.4 oz (59.1 kg)   BMI 22.38 kg/m    Physical Exam Vitals reviewed.  Constitutional:      Appearance: Normal appearance. She is normal weight.  Eyes:     Conjunctiva/sclera: Conjunctivae normal.  Pulmonary:     Effort: Pulmonary effort is normal.  Musculoskeletal:     Right hip: No deformity or tenderness. Normal range of motion. Decreased strength.  Neurological:     General: No focal deficit present.     Mental Status: She is alert and oriented to person, place, and time. Mental status is at baseline.  Psychiatric:        Mood and Affect: Mood normal.        Behavior: Behavior normal.     No results found for any visits on 03/05/23.    EXAM: LUMBAR SPINE - 2-3 VIEW   COMPARISON:  Prior study from 2015   FINDINGS: Significant left convex lumbar scoliosis and associated degenerative lumbar spondylosis with lower lumbar disc disease and facet disease. The spinous processes are also enlarged and appear to be touching suggesting  Basstrup's disease. No acute bony findings or destructive bony changes. Vascular calcifications are noted. Radiodensity in the left upper quadrant likely a pill in the stomach. The bony pelvis is intact. Mild hip and SI joint degenerative changes.   IMPRESSION: 1. Scoliosis and degenerative lumbar spondylosis with lower lumbar disc disease and facet disease. 2. Suspect Basstrup's disease. 3. No acute bony findings.      Assessment & Plan:   Problem List Items Addressed This Visit   None Visit Diagnoses     Baastrup disease of lumbar spine    -  Primary   Relevant Medications   Seen on lumbar films in 12/23. She had an episode of acute back pain at that time and now I feel that the symptoms of being referred to her right leg and she is having problems walking due to the issue. I will treat with a medrol dose pak to help reduce the inflammation on the nerve and will place orders for referral to PT and neurosurgery.   methylPREDNISolone (MEDROL DOSEPAK) 4 MG TBPK tablet   Other Relevant Orders   Ambulatory referral to Neurosurgery   Ambulatory referral to Physical Therapy   Acute left-sided low back pain without sciatica       Relevant  Medications   Back is no longer hurting but I am concerned about nerve impingement now that is affecting her right leg.  methylPREDNISolone (MEDROL DOSEPAK) 4 MG TBPK tablet   Other Relevant Orders   Ambulatory referral to Physical Therapy   Acute leg pain, right       Relevant Orders   Ambulatory referral to Physical Therapy       Meds ordered this encounter  Medications   methylPREDNISolone (MEDROL DOSEPAK) 4 MG TBPK tablet    Sig: Take package as directed    Dispense:  21 each    Refill:  0    No follow-ups on file.  Farrel Conners, MD

## 2023-03-09 ENCOUNTER — Telehealth: Payer: Self-pay | Admitting: Family Medicine

## 2023-03-09 NOTE — Telephone Encounter (Signed)
Contacted Emma Hampton to schedule their annual wellness visit. Appointment made for 03/10/23.  [facility] AWV direct phone # [phone]

## 2023-03-10 ENCOUNTER — Telehealth (INDEPENDENT_AMBULATORY_CARE_PROVIDER_SITE_OTHER): Payer: Medicare Other | Admitting: Family Medicine

## 2023-03-10 DIAGNOSIS — Z0000 Encounter for general adult medical examination without abnormal findings: Secondary | ICD-10-CM

## 2023-03-10 NOTE — Patient Instructions (Signed)
I really enjoyed getting to talk with you today! I am available on Tuesdays and Thursdays for virtual visits if you have any questions or concerns, or if I can be of any further assistance.   CHECKLIST FROM ANNUAL WELLNESS VISIT:  -Follow up (please call to schedule if not scheduled after visit):   -yearly for annual wellness visit with primary care office  Here is a list of your preventive care/health maintenance measures and the plan for each if any are due:  Health Maintenance  Topic Date Due   Hepatitis C Screening  Never done   DTaP/Tdap/Td (1 - Tdap) Never done   Pneumonia Vaccine 89+ Years old (2 of 2 - PPSV23 or PCV20) 05/17/2018   COVID-19 Vaccine (3 - 2023-24 season) 03/21/2023 (Originally 08/22/2022)   INFLUENZA VACCINE  03/23/2023 (Originally 07/22/2022)   Medicare Annual Wellness ([place])  03/09/2024   MAMMOGRAM  02/04/2025   COLONOSCOPY (Pts 45-9yrs Insurance coverage will need to be confirmed)  07/31/2030   DEXA SCAN  Completed   Zoster Vaccines- Shingrix  Completed   HPV VACCINES  Aged Out    -See a dentist at least yearly  -Get your eyes checked and then per your eye specialist's recommendations  -Other issues addressed today:  -advise no more than one 5oz glass of wine per 24 hour period  -I have included below further information regarding a healthy whole foods based diet, physical activity guidelines for adults, stress management and opportunities for social connections. I hope you find this information useful.   -----------------------------------------------------------------------------------------------------------------------------------------------------------------------------------------------------------------------------------------------------------  NUTRITION: -eat real food: lots of colorful vegetables (half the plate) and fruits -5-7 servings of vegetables and fruits per day (fresh or steamed is best), exp. 2 servings of vegetables with lunch and  dinner and 2 servings of fruit per day. Berries and greens such as kale and collards are great choices.  -consume on a regular basis: whole grains (make sure first ingredient on label contains the word "whole"), fresh fruits, fish, nuts, seeds, healthy oils (such as olive oil, avocado oil, grape seed oil) -may eat small amounts of dairy and lean meat on occasion, but avoid processed meats such as ham, bacon, lunch meat, etc. -drink water -try to avoid fast food and pre-packaged foods, processed meat -most experts advise limiting sodium to < 2300mg  per day, should limit further is any chronic conditions such as high blood pressure, heart disease, diabetes, etc. The American Heart Association advised that < 1500mg  is is ideal -try to avoid foods that contain any ingredients with names you do not recognize  -try to avoid sugar/sweets (except for the natural sugar that occurs in fresh fruit) -try to avoid sweet drinks -try to avoid white rice, white bread, pasta (unless whole grain), white or yellow potatoes  EXERCISE GUIDELINES FOR ADULTS: -if you wish to increase your physical activity, do so gradually and with the approval of your doctor -STOP and seek medical care immediately if you have any chest pain, chest discomfort or trouble breathing when starting or increasing exercise  -move and stretch your body, legs, feet and arms when sitting for long periods -Physical activity guidelines for optimal health in adults: -least 150 minutes per week of aerobic exercise (can talk, but not sing) once approved by your doctor, 20-30 minutes of sustained activity or two 10 minute episodes of sustained activity every day.  -resistance training at least 2 days per week if approved by your doctor -balance exercises 3+ days per week:   Stand somewhere where  you have something sturdy to hold onto if you lose balance.    1) lift up on toes, start with 5x per day and work up to 20x   2) stand and lift on leg  straight out to the side so that foot is a few inches of the floor, start with 5x each side and work up to 20x each side   3) stand on one foot, start with 5 seconds each side and work up to 20 seconds on each side  STRESS MANAGEMENT: -can try meditating, or just sitting quietly with deep breathing while intentionally relaxing all parts of your body for 5 minutes daily -if you need further help with stress, anxiety or depression please follow up with your primary doctor or contact the wonderful folks at [facility]: [phone]

## 2023-03-10 NOTE — Progress Notes (Signed)
PATIENT CHECK-IN and HEALTH RISK ASSESSMENT QUESTIONNAIRE:  -completed by phone/video for upcoming Medicare Preventive Visit  Pre-Visit Check-in: 1)Vitals (height, wt, BP, etc) - record in vitals section for visit on day of visit 2)Review and Update Medications, Allergies PMH, Surgeries, Social history in Epic 3)Hospitalizations in the last year with date/reason? No   4)Review and Update Care Team (patient's specialists) in Epic 5) Complete PHQ9 in Epic  6) Complete Fall Screening in Epic 7)Review all Health Maintenance Due and order under PCP if not done.  8)Medicare Wellness Questionnaire: Answer theses question about your habits: Do you drink alcohol? Yes If yes, how many drinks do you have a day? 3 drinks of wine per week Have you ever smoked?Yes   Quit date if applicable?  Quit in 1980's How many packs a day do/did you smoke? 1 cig per day  Do you use smokeless tobacco?No  Do you use an illicit drugs?No  Do you exercises? Yes   IF so, what type and how many days/minutes per week?walk few times per week at mall. 1 hour each time - also does pole Are you sexually active? Yes Number of partners?1 Typical breakfast: grits, eggs, toast, oatmeal Typical lunch: no lunch, due to eating a late breakfast Typical dinner: fish,vegetables, rice Typical snacks: hummus, chips, chocolate covered raisinets, celery sticks  Beverages: water,coconut, mango water.  Ginger beer  Answer theses question about you: Can you perform most household chores?Yes Do you find it hard to follow a conversation in a noisy room?Yes Do you often ask people to speak up or repeat themselves?No Do you feel that you have a problem with memory?Sometimes due to stress Do you balance your checkbook and or bank acounts?Yes Do you feel safe at home?Yes Last dentist visit?2 weeks ago Do you need assistance with any of the following: Please note if so   Driving?-No   Feeding yourself?-No  Getting from bed to  chair?-No  Getting to the toilet?-No  Bathing or showering?-No  Dressing yourself?-No  Managing money?-No  Climbing a flight of stairs-No  Preparing meals?-No  Do you have Advanced Directives in place (Living Will, Healthcare Power or Attorney)? Yes, patient have a Will   Last eye Exam and location?2023. [facility] on [address]   Do you currently use prescribed or non-prescribed narcotic or opioid pain medications?No, tylenol, Ibuprofen  Do you have a history or close family history of breast, ovarian, tubal or peritoneal cancer or a family member with BRCA (breast cancer susceptibility 1 and 2) gene mutations?  Nurse/Assistant Credentials/time stamp:st   ----------------------------------------------------------------------------------------------------------------------------------------------------------------------------------------------------------------------   MEDICARE ANNUAL PREVENTIVE VISIT WITH PROVIDER: (Welcome to [employer], initial annual wellness or annual wellness exam)  Virtual Visit via Video Note  I connected with Iyahna Langsam Westry on 03/10/23 by a video enabled telemedicine application and verified that I am speaking with the correct person using two identifiers.  Location patient: home Location provider:work or home office Persons participating in the virtual visit: patient, provider  Concerns and/or follow up today: Saw PCP very recently - doing better and sees back specialist this week for this.    See HM section in Epic for other details of completed HM.    ROS: negative for report of fevers, unintentional weight loss, vision changes, vision loss, hearing loss or change, chest pain, sob, hemoptysis, melena, hematochezia, hematuria, weakness or numbness,falls, bleeding or bruising, loc, thoughts of suicide or self harm, memory loss  Patient-completed extensive health risk assessment - reviewed and discussed with the  patient: See Health Risk Assessment completed with patient prior to the visit either above or in recent phone note. This was reviewed in detailed with the patient today and appropriate recommendations, orders and referrals were placed as needed per Summary below and patient instructions.   Review of Medical History: -PMH, PSH, Family History and current specialty and care providers reviewed and updated and listed below   Patient Care Team: Farrel Conners, MD as PCP - General (Family Medicine)   Past Medical History:  Diagnosis Date   Diabetes mellitus without complication ([place])    Fibroids    Retroperitoneal abscess [facility])     Past Surgical History:  Procedure Laterality Date   BREAST BIOPSY Right    CESAREAN SECTION     CYST EXCISION     TUBAL LIGATION      Social History   Socioeconomic History   Marital status: Widowed    Spouse name: Not on file   Number of children: Not on file   Years of education: Not on file   Highest education level: Not on file  Occupational History   Not on file  Tobacco Use   Smoking status: Former    Types: Cigarettes    Quit date: 11/03/2015    Years since quitting: 7.3   Smokeless tobacco: Never  Vaping Use   Vaping Use: Former  Substance and Sexual Activity   Alcohol use: Yes    Alcohol/week: 1.0 standard drink of alcohol    Types: 1 Glasses of wine per week   Drug use: No   Sexual activity: Yes    Birth control/protection: Post-menopausal, None  Other Topics Concern   Not on file  Social History Narrative   Not on file   Social Determinants of Health   Financial Resource Strain: Not on file  Food Insecurity: Not on file  Transportation Needs: Not on file  Physical Activity: Not on file  Stress: Not on file  Social Connections: Not on file  Intimate Partner Violence: Not on file    Family History  Problem Relation Age of Onset   Heart disease Mother        MI   Cancer Daughter        BREAST   LEFT MASECT    Breast cancer Daughter    Diabetes Maternal Uncle    Prostate cancer Maternal Uncle    Heart attack Maternal Grandmother    Dementia Maternal Grandfather    Diabetes Other     Current Outpatient Medications on File Prior to Visit  Medication Sig Dispense Refill   ascorbic acid (VITAMIN C) 500 MG tablet Take 500 mg by mouth daily.     atorvastatin (LIPITOR) 20 MG tablet TAKE 1 TABLET BY MOUTH EVERY NIGHT AT BEDTIME 90 tablet 1   EPINEPHrine 0.3 mg/0.3 mL IJ SOAJ injection Inject 0.3 mg into the muscle as needed for anaphylaxis. 1 each 1   methylPREDNISolone (MEDROL DOSEPAK) 4 MG TBPK tablet Take package as directed 21 each 0   Multiple Vitamin (MULTIVITAMIN) tablet Take 1 tablet by mouth daily.     Potassium Chloride ER 20 MEQ TBCR TAKE 1 TABLET BY MOUTH EVERY DAY 90 tablet 1   No current facility-administered medications on file prior to visit.    Allergies  Allergen Reactions   Codeine Nausea And Vomiting   Bee Venom        Physical Exam There were no vitals filed for this visit. Estimated body mass index is 22.38 kg/m  as calculated from the following:   Height as of 12/04/22: 5\' 4"  (1.626 m).   Weight as of 03/05/23: 130 lb 6.4 oz (59.1 kg).  EKG (optional): deferred due to virtual visit  GENERAL: alert, oriented, no acute distress detected, full vision exam deferred due to pandemic and/or virtual encounter  HEENT: atraumatic, conjunttiva clear, no obvious abnormalities on inspection of external nose and ears  NECK: normal movements of the head and neck  LUNGS: on inspection no signs of respiratory distress, breathing rate appears normal, no obvious gross SOB, gasping or wheezing  CV: no obvious cyanosis  MS: moves all visible extremities without noticeable abnormality  PSYCH/NEURO: pleasant and cooperative, no obvious depression or anxiety, speech and thought processing grossly intact, Cognitive function grossly intact  Flowsheet Row Video Visit from 03/10/2023 in  [facility]  PHQ-9 Total Score 6           03/10/2023    3:27 PM 11/03/2022    2:22 PM 09/24/2022   10:23 AM  Depression screen PHQ 2/9  Decreased Interest 0 0 0  Down, Depressed, Hopeless 0 0 0  PHQ - 2 Score 0 0 0  Altered sleeping 2 0 0  Tired, decreased energy 2 0 0  Change in appetite 2 0 0  Feeling bad or failure about yourself  0 0 0  Trouble concentrating 0 0 0  Moving slowly or fidgety/restless 0 0 0  Suicidal thoughts 0 0 0  PHQ-9 Score 6 0 0  Difficult doing work/chores Somewhat difficult    Reports doing ok - exercise helps. Deals with a lot of stress.      06/05/2021   11:34 AM 09/18/2022   10:56 PM 11/03/2022    2:21 PM 03/05/2023    9:15 AM 03/10/2023    3:26 PM  Fall Risk  Falls in the past year?   0 0 0  Was there an injury with Fall?   0 0 0  Fall Risk Category Calculator   0 0 0  Fall Risk Category (Retired)   Low    (RETIRED) Patient Fall Risk Level Low fall risk Low fall risk Low fall risk    Patient at Risk for Falls Due to   No Fall Risks    Fall risk Follow up   Falls evaluation completed Falls evaluation completed      SUMMARY AND PLAN:  Encounter for Medicare annual wellness exam     Discussed applicable health maintenance/preventive health measures and advised and referred or ordered per patient preferences:  Health Maintenance  Topic Date Due   Hepatitis C Screening  Never done, discussed, advised can do with labs if wishes   DTaP/Tdap/Td (1 - Tdap) Never done, she plans to get - advised if does at pharmacy to let us know so that we can update her chart.    Pneumonia Vaccine 25+ Years old (2 of 2 - PPSV23 or PCV20) 05/17/2018, she plans to get, advised if does at pharmacy to let us know so that we can update her chart.    COVID-19 Vaccine (3 - 2023-24 season) 03/21/2023 (Originally 08/22/2022)   INFLUENZA VACCINE  03/23/2023 (Originally 07/22/2022)   Medicare Annual Wellness (AWV)  03/09/2024   MAMMOGRAM   02/04/2025   COLONOSCOPY (Pts 45-55yrs Insurance coverage will need to be confirmed)  07/31/2030   DEXA SCAN  Completed in 2016, due, discussed and she wishes to repeat - asked staff to order. Advised pt to call  in 1 week to check on status of order.    Zoster Vaccines- Shingrix  Completed   HPV VACCINES  Aged [employer] and counseling on the following was provided based on the above review of health and a plan/checklist for the patient, along with additional information discussed, was provided for the patient in the patient instructions :   -Advised and counseled on a whole foods based healthy diet and regular exercise:  A summary of a healthy diet was provided in the Patient Instructions. -congratulated on regular exercise and recommended to continue after seeing back specialist and discussed guidelines -went over safe balance exercises -Advise yearly dental visits at minimum and regular eye exams -Advise no more than one 5oz glass of wine per 24 hour period.  Follow up: see patient instructions     Patient Instructions  I really enjoyed getting to talk with you today! I am available on Tuesdays and Thursdays for virtual visits if you have any questions or concerns, or if I can be of any further assistance.   CHECKLIST FROM ANNUAL WELLNESS VISIT:  -Follow up (please call to schedule if not scheduled after visit):   -yearly for annual wellness visit with primary care office  Here is a list of your preventive care/health maintenance measures and the plan for each if any are due:  Health Maintenance  Topic Date Due   Hepatitis C Screening  Never done   DTaP/Tdap/Td (1 - Tdap) Never done   Pneumonia Vaccine 14+ Years old (2 of 2 - PPSV23 or PCV20) 05/17/2018   COVID-19 Vaccine (3 - 2023-24 season) 03/21/2023 (Originally 08/22/2022)   INFLUENZA VACCINE  03/23/2023 (Originally 07/22/2022)   Medicare Annual Wellness ([place])  03/09/2024   MAMMOGRAM  02/04/2025   COLONOSCOPY (Pts  45-21yrs Insurance coverage will need to be confirmed)  07/31/2030   DEXA SCAN  Completed   Zoster Vaccines- Shingrix  Completed   HPV VACCINES  Aged Out    -See a dentist at least yearly  -Get your eyes checked and then per your eye specialist's recommendations  -Other issues addressed today:  -advise no more than one 5oz glass of wine per 24 hour period  -I have included below further information regarding a healthy whole foods based diet, physical activity guidelines for adults, stress management and opportunities for social connections. I hope you find this information useful.   -----------------------------------------------------------------------------------------------------------------------------------------------------------------------------------------------------------------------------------------------------------  NUTRITION: -eat real food: lots of colorful vegetables (half the plate) and fruits -5-7 servings of vegetables and fruits per day (fresh or steamed is best), exp. 2 servings of vegetables with lunch and dinner and 2 servings of fruit per day. Berries and greens such as kale and collards are great choices.  -consume on a regular basis: whole grains (make sure first ingredient on label contains the word "whole"), fresh fruits, fish, nuts, seeds, healthy oils (such as olive oil, avocado oil, grape seed oil) -may eat small amounts of dairy and lean meat on occasion, but avoid processed meats such as ham, bacon, lunch meat, etc. -drink water -try to avoid fast food and pre-packaged foods, processed meat -most experts advise limiting sodium to < 2300mg  per day, should limit further is any chronic conditions such as high blood pressure, heart disease, diabetes, etc. The American Heart Association advised that < 1500mg  is is ideal -try to avoid foods that contain any ingredients with names you do not recognize  -try to avoid sugar/sweets (except for the natural sugar  that occurs in fresh  fruit) -try to avoid sweet drinks -try to avoid white rice, white bread, pasta (unless whole grain), white or yellow potatoes  EXERCISE GUIDELINES FOR ADULTS: -if you wish to increase your physical activity, do so gradually and with the approval of your doctor -STOP and seek medical care immediately if you have any chest pain, chest discomfort or trouble breathing when starting or increasing exercise  -move and stretch your body, legs, feet and arms when sitting for long periods -Physical activity guidelines for optimal health in adults: -least 150 minutes per week of aerobic exercise (can talk, but not sing) once approved by your doctor, 20-30 minutes of sustained activity or two 10 minute episodes of sustained activity every day.  -resistance training at least 2 days per week if approved by your doctor -balance exercises 3+ days per week:   Stand somewhere where you have something sturdy to hold onto if you lose balance.    1) lift up on toes, start with 5x per day and work up to 20x   2) stand and lift on leg straight out to the side so that foot is a few inches of the floor, start with 5x each side and work up to 20x each side   3) stand on one foot, start with 5 seconds each side and work up to 20 seconds on each side  STRESS MANAGEMENT: -can try meditating, or just sitting quietly with deep breathing while intentionally relaxing all parts of your body for 5 minutes daily -if you need further help with stress, anxiety or depression please follow up with your primary doctor or contact the wonderful folks at [place]: [place], DO

## 2023-03-11 ENCOUNTER — Other Ambulatory Visit: Payer: Self-pay

## 2023-03-11 ENCOUNTER — Ambulatory Visit: Payer: Medicare Other | Attending: Family Medicine

## 2023-03-11 DIAGNOSIS — R2689 Other abnormalities of gait and mobility: Secondary | ICD-10-CM

## 2023-03-11 DIAGNOSIS — M79604 Pain in right leg: Secondary | ICD-10-CM | POA: Diagnosis not present

## 2023-03-11 DIAGNOSIS — M4826 Kissing spine, lumbar region: Secondary | ICD-10-CM

## 2023-03-11 DIAGNOSIS — M5459 Other low back pain: Secondary | ICD-10-CM

## 2023-03-11 DIAGNOSIS — M6281 Muscle weakness (generalized): Secondary | ICD-10-CM

## 2023-03-11 DIAGNOSIS — Z0000 Encounter for general adult medical examination without abnormal findings: Secondary | ICD-10-CM

## 2023-03-11 DIAGNOSIS — M545 Low back pain, unspecified: Secondary | ICD-10-CM | POA: Diagnosis not present

## 2023-03-11 DIAGNOSIS — M8589 Other specified disorders of bone density and structure, multiple sites: Secondary | ICD-10-CM

## 2023-03-11 NOTE — Therapy (Signed)
OUTPATIENT PHYSICAL THERAPY THORACOLUMBAR EVALUATION   Patient Name: Emma Hampton MRN: [id number] DOB:Dec 03, 1956, 67 y.o., female Today's Date: 03/11/2023  END OF SESSION:  PT End of Session - 03/11/23 1325     Visit Number 1    Number of Visits 17    Date for PT Re-Evaluation 05/06/23    Authorization Type Medicare    PT Start Time 1000    PT Stop Time 1040    PT Time Calculation (min) 40 min    Activity Tolerance Patient tolerated treatment well    Behavior During Therapy [facility] for tasks assessed/performed             Past Medical History:  Diagnosis Date   Diabetes mellitus without complication ([place])    Fibroids    Retroperitoneal abscess ([place])    Past Surgical History:  Procedure Laterality Date   BREAST BIOPSY Right    CESAREAN SECTION     CYST EXCISION     TUBAL LIGATION     Patient Active Problem List   Diagnosis Date Noted   Prediabetes 08/01/2022   Allergy to bee sting 07/31/2022   Postural dizziness with presyncope 07/31/2022   Meningitis, viral 09/10/2013    PCP:  Farrel Conners, MD  REFERRING PROVIDER: Farrel Conners, MD  REFERRING DIAG:  [phone] (ICD-10-CM) - [place] disease of lumbar spine M54.50 (ICD-10-CM) - Acute left-sided low back pain without sciatica M79.604 (ICD-10-CM) - Acute leg pain, right  Rationale for Evaluation and Treatment: Rehabilitation  THERAPY DIAG:  Other low back pain - Plan: PT plan of care cert/re-cert  Muscle weakness (generalized) - Plan: PT plan of care cert/re-cert  Other abnormalities of gait and mobility - Plan: PT plan of care cert/re-cert  ONSET DATE: Acute on Chronic  SUBJECTIVE:                                                                                                                                                                                           SUBJECTIVE STATEMENT: Pt presents to PT with reports of acute on chronic LBP with referral into R LE. Will have occasional  weakness in R LE, has had a few instances of buckling. Has some occasional tingling down R LE, especially with prolonged sitting. Pt notes some recent trouble with stair navigation, with R LE in stance greatly increasing pain. She has had a perform a step-to pattern since. Denies bowel.bladder changes or saddle anesthesia.   PERTINENT HISTORY:  DM II  PAIN:  Are you having pain?  Yes: NPRS scale: 6/10 Worst: 10/10 Pain location: L lower back; R LE Pain description: achy Aggravating factors:  stairs, prolonged standing Relieving factors: topical analgesics   PRECAUTIONS: None  WEIGHT BEARING RESTRICTIONS: No  FALLS:  Has patient fallen in last 6 months? No  LIVING ENVIRONMENT: Lives with: lives alone Lives in: House/apartment Stairs: Yes: Internal: 15 steps; bilateral but cannot reach both Has following equipment at home: None  OCCUPATION: Retired  PLOF: Independent  PATIENT GOALS: Pt wants to decrease LBP to improve comfort with home activities and community navigation  OBJECTIVE:   DIAGNOSTIC FINDINGS:  CLINICAL DATA:  Back pain.   EXAM: LUMBAR SPINE - 2-3 VIEW   COMPARISON:  Prior study from 2015   FINDINGS: Significant left convex lumbar scoliosis and associated degenerative lumbar spondylosis with lower lumbar disc disease and facet disease. The spinous processes are also enlarged and appear to be touching suggesting Basstrup's disease. No acute bony findings or destructive bony changes. Vascular calcifications are noted. Radiodensity in the left upper quadrant likely a pill in the stomach. The bony pelvis is intact. Mild hip and SI joint degenerative changes.   IMPRESSION: 1. Scoliosis and degenerative lumbar spondylosis with lower lumbar disc disease and facet disease. 2. Suspect Basstrup's disease. 3. No acute bony findings.  PATIENT SURVEYS:  FOTO: 48% function; 65% predicted   COGNITION: Overall cognitive status: Within functional limits for tasks  assessed     SENSATION: WFL  MUSCLE LENGTH: Thomas test: Right (+); Left (+)  POSTURE: increased lumbar lordosis  PALPATION: Increased tone and TTP to bilateral lumbar paraspinals  LUMBAR ROM:   AROM eval  Flexion Decreased; slight pain  Extension Decreased; very painful  Right lateral flexion   Left lateral flexion   Right rotation   Left rotation    (Blank rows = not tested)  LOWER EXTREMITY MMT:    MMT Right eval Left eval  Hip flexion 4/5 4/5  Hip extension    Hip abduction 3+/5 3+/5  Hip adduction    Hip internal rotation    Hip external rotation    Knee flexion    Knee extension    Ankle dorsiflexion    Ankle plantarflexion    Ankle inversion    Ankle eversion     (Blank rows = not tested)  LUMBAR SPECIAL TESTS:  Straight leg raise test: Positive and Slump test: Positive  FUNCTIONAL TESTS:  30 Second Sit to Stand: 8 reps  GAIT: Distance walked: 48ft Assistive device utilized: None Level of assistance: Complete Independence Comments: antalgic gait R, decreased gait speed  TREATMENT: [facility] Adult PT Treatment:                                                DATE: 03/11/2023 Therapeutic Exercise: Seated sciatic nerve glide x 5 R Modified thomas stretch x 30" each Supine PPT x 5 - 5" hold LTR x 5 - 5" hold DKTC x 30" hold  PATIENT EDUCATION:  Education details: eval findings, FOTO, HEP, POC Person educated: Patient Education method: Explanation, Demonstration, and Handouts Education comprehension: verbalized understanding and returned demonstration  HOME EXERCISE PROGRAM: Access Code: [email or web address] URL: [email or web address] Date: 03/11/2023 Prepared by: Octavio Manns  Exercises - Seated Sciatic Tensioner  - 1-2 x daily - 7 x weekly - 2 sets - 10 reps - Modified Thomas Stretch  - 1-2 x daily - 7 x weekly - 2 reps - 30 sec hold - Supine Posterior Pelvic Tilt  - 1-2  x daily - 7 x weekly - 2 sets - 10 reps - 5 sec hold - Supine Lower  Trunk Rotation  - 1-2 x daily - 7 x weekly - 2 sets - 10 reps - 5 sec hold - Supine Double Knee to Chest  - 1-2 x daily - 7 x weekly - 2-3 reps - 30 sec hold  ASSESSMENT:  CLINICAL IMPRESSION: Patient is a 67 y.o. F who was seen today for physical therapy evaluation and treatment for acute on chronic LBP with R LRE referral. Physical findings are consistent with MD impression as pt has increased lumbar lordosis, tightness in bilateral hip flexors, and core and proximal hip weakness. Her FOTO score indicates decrease in functional ability below PLOF. Pt would benefit from skilled PT services working on improving muscle length and posture as well as core and proximal hip strength in order to decrease pain and improve function.    OBJECTIVE IMPAIRMENTS: decreased activity tolerance, decreased mobility, difficulty walking, decreased ROM, decreased strength, postural dysfunction, and pain.   ACTIVITY LIMITATIONS: carrying, lifting, standing, squatting, and locomotion level  PARTICIPATION LIMITATIONS: meal prep, cleaning, driving, shopping, community activity, and yard work  PERSONAL FACTORS: Time since onset of injury/illness/exacerbation and 1-2 comorbidities: DM II  are also affecting patient's functional outcome.   REHAB POTENTIAL: Excellent  CLINICAL DECISION MAKING: Evolving/moderate complexity  EVALUATION COMPLEXITY: Moderate   GOALS: Goals reviewed with patient? No  SHORT TERM GOALS: Target date: 04/01/2023   Pt will be compliant and knowledgeable with initial HEP for improved comfort and carryover Baseline: initial HEP given  Goal status: INITIAL  2.  Pt will self report back and R LE pain no greater than 6/10 for improved comfort and functional ability Baseline: 10/10 at worst Goal status: INITIAL   LONG TERM GOALS: Target date: 05/06/2023   Pt will improve FOTO function score to no less than 65% as proxy for functional improvement Baseline: 48% function Goal status: INITIAL    2.  Pt will self report back and R LE pain no greater than 3/10 for improved comfort and functional ability Baseline: 10/10 at worst Goal status: INITIAL   3.  Pt will increase 30 Second Sit to Stand rep count to no less than 10 reps for improved balance, strength, and functional mobility Baseline: 8 reps (MCID 2 reps) Goal status: INITIAL   4.  Pt will be able to navigate 15 stairs with reciprocal gait and no increase in pain for improved home navigation and functional mobility Baseline: unable Goal status: INITIAL  PLAN:  PT FREQUENCY: 2x/week  PT DURATION: 8 weeks  PLANNED INTERVENTIONS: Therapeutic exercises, Therapeutic activity, Neuromuscular re-education, Balance training, Gait training, Patient/Family education, Self Care, Joint mobilization, Dry Needling, Electrical stimulation, Cryotherapy, Moist heat, Vasopneumatic device, Manual therapy, and Re-evaluation.  PLAN FOR NEXT SESSION: assess HEP response, progress core strength, hip flexor stretching   Ward Chatters, PT 03/11/2023, 1:33 PM

## 2023-03-13 NOTE — Therapy (Unsigned)
OUTPATIENT PHYSICAL THERAPY TREATMENT NOTE   Patient Name: Emma Hampton MRN: [id number] DOB:[birth year], 67 y.o., female Today's Date: 03/16/2023  PCP: Farrel Conners, MD  REFERRING PROVIDER: Farrel Conners, MD   END OF SESSION:   PT End of Session - 03/16/23 1047     Visit Number 2    Number of Visits 17    Date for PT Re-Evaluation 05/06/23    Authorization Type Medicare    PT Start Time 1047    PT Stop Time 1127    PT Time Calculation (min) 40 min    Activity Tolerance Patient tolerated treatment well    Behavior During Therapy WFL for tasks assessed/performed             Past Medical History:  Diagnosis Date   Diabetes mellitus without complication ([place])    Fibroids    Retroperitoneal abscess ([place])    Past Surgical History:  Procedure Laterality Date   BREAST BIOPSY Right    CESAREAN SECTION     CYST EXCISION     TUBAL LIGATION     Patient Active Problem List   Diagnosis Date Noted   Prediabetes 08/01/2022   Allergy to bee sting 07/31/2022   Postural dizziness with presyncope 07/31/2022   Meningitis, viral 09/10/2013    REFERRING DIAG: M48.26 (ICD-10-CM) - Baastrup disease of lumbar spine M54.50 (ICD-10-CM) - Acute left-sided low back pain without sciatica M79.604 (ICD-10-CM) - Acute leg pain, right  THERAPY DIAG:  Other low back pain - Plan: PT plan of care cert/re-cert   Muscle weakness (generalized) - Plan: PT plan of care cert/re-cert   Other abnormalities of gait and mobility - Plan: PT plan of care cert/re-cert  Rationale for Evaluation and Treatment Rehabilitation  PERTINENT HISTORY: DM II   PRECAUTIONS: scoliosis and "kissing" spine   SUBJECTIVE:                                                                                                                                                                                      SUBJECTIVE STATEMENT:  Minimal low back pain today, notes pain during sleep.  Notes occasional LE  radicular symptoms.  Will see spine specialist later today.   PAIN:  Are you having pain? Yes: NPRS scale: 10/10 Pain location: low back Pain description: ache Aggravating factors: undetermined Relieving factors: pain patches   OBJECTIVE: (objective measures completed at initial evaluation unless otherwise dated)   DIAGNOSTIC FINDINGS:  CLINICAL DATA:  Back pain.   EXAM: LUMBAR SPINE - 2-3 VIEW   COMPARISON:  Prior study from 2015   FINDINGS: Significant left convex lumbar scoliosis and associated degenerative lumbar spondylosis with  lower lumbar disc disease and facet disease. The spinous processes are also enlarged and appear to be touching suggesting Basstrup's disease. No acute bony findings or destructive bony changes. Vascular calcifications are noted. Radiodensity in the left upper quadrant likely a pill in the stomach. The bony pelvis is intact. Mild hip and SI joint degenerative changes.   IMPRESSION: 1. Scoliosis and degenerative lumbar spondylosis with lower lumbar disc disease and facet disease. 2. Suspect Basstrup's disease. 3. No acute bony findings.   PATIENT SURVEYS:  FOTO: 48% function; 65% predicted    COGNITION: Overall cognitive status: Within functional limits for tasks assessed                          SENSATION: WFL   MUSCLE LENGTH: Thomas test: Right (+); Left (+)   POSTURE: increased lumbar lordosis   PALPATION: Increased tone and TTP to bilateral lumbar paraspinals   LUMBAR ROM:    AROM eval  Flexion Decreased; slight pain  Extension Decreased; very painful  Right lateral flexion    Left lateral flexion    Right rotation    Left rotation     (Blank rows = not tested)   LOWER EXTREMITY MMT:     MMT Right eval Left eval  Hip flexion 4/5 4/5  Hip extension      Hip abduction 3+/5 3+/5  Hip adduction      Hip internal rotation      Hip external rotation      Knee flexion      Knee extension      Ankle dorsiflexion       Ankle plantarflexion      Ankle inversion      Ankle eversion       (Blank rows = not tested)   LUMBAR SPECIAL TESTS:  Straight leg raise test: Positive and Slump test: Positive   FUNCTIONAL TESTS:  30 Second Sit to Stand: 8 reps   GAIT: Distance walked: 76ft Assistive device utilized: None Level of assistance: Complete Independence Comments: antalgic gait R, decreased gait speed   TREATMENT: [facility] Adult PT Treatment:                                                DATE: 03/16/23 Therapeutic Exercise: Nustep L2 6 min Seated sciatic nerve glide x 5 B Modified thomas stretch 30s x2 Bil Supine PPT x 10 5" hold Supine march 10/10 LTR x 5 - 5" hold DKTC x 30" hold Supine hip fallouts RTB 10x B, 10/10 unilateral 90/90 30s x2  [facility] Adult PT Treatment:                                                DATE: 03/11/2023 Therapeutic Exercise: Seated sciatic nerve glide x 5 R Modified thomas stretch x 30" each Supine PPT x 5 - 5" hold LTR x 5 - 5" hold DKTC x 30" hold   PATIENT EDUCATION:  Education details: eval findings, FOTO, HEP, POC Person educated: Patient Education method: Explanation, Demonstration, and Handouts Education comprehension: verbalized understanding and returned demonstration   HOME EXERCISE PROGRAM: Access Code: [email or web address] URL: [email or web address] Date: 03/11/2023 Prepared by: Octavio Manns   Exercises -  Seated Sciatic Tensioner  - 1-2 x daily - 7 x weekly - 2 sets - 10 reps - Modified Thomas Stretch  - 1-2 x daily - 7 x weekly - 2 reps - 30 sec hold - Supine Posterior Pelvic Tilt  - 1-2 x daily - 7 x weekly - 2 sets - 10 reps - 5 sec hold - Supine Lower Trunk Rotation  - 1-2 x daily - 7 x weekly - 2 sets - 10 reps - 5 sec hold - Supine Double Knee to Chest  - 1-2 x daily - 7 x weekly - 2-3 reps - 30 sec hold   ASSESSMENT:   CLINICAL IMPRESSION:  Patient returns for first f/u visit.  Reviewed HEP and updated/corrected as appropriate.   Continued with flexion based tasks and core strengthening.  Focus on hip strengthening and flexibility as well.  Incorporated aerobic work into session.  Able to complete all tasks w/o any reported increase in symptoms.   Patient is a 67 y.o. F who was seen today for physical therapy evaluation and treatment for acute on chronic LBP with R LRE referral. Physical findings are consistent with MD impression as pt has increased lumbar lordosis, tightness in bilateral hip flexors, and core and proximal hip weakness. Her FOTO score indicates decrease in functional ability below PLOF. Pt would benefit from skilled PT services working on improving muscle length and posture as well as core and proximal hip strength in order to decrease pain and improve function.     OBJECTIVE IMPAIRMENTS: decreased activity tolerance, decreased mobility, difficulty walking, decreased ROM, decreased strength, postural dysfunction, and pain.    ACTIVITY LIMITATIONS: carrying, lifting, standing, squatting, and locomotion level   PARTICIPATION LIMITATIONS: meal prep, cleaning, driving, shopping, community activity, and yard work   PERSONAL FACTORS: Time since onset of injury/illness/exacerbation and 1-2 comorbidities: DM II  are also affecting patient's functional outcome.    REHAB POTENTIAL: Excellent   CLINICAL DECISION MAKING: Evolving/moderate complexity   EVALUATION COMPLEXITY: Moderate     GOALS: Goals reviewed with patient? No   SHORT TERM GOALS: Target date: 04/01/2023   Pt will be compliant and knowledgeable with initial HEP for improved comfort and carryover Baseline: initial HEP given  Goal status: INITIAL   2.  Pt will self report back and R LE pain no greater than 6/10 for improved comfort and functional ability Baseline: 10/10 at worst Goal status: INITIAL    LONG TERM GOALS: Target date: 05/06/2023   Pt will improve FOTO function score to no less than 65% as proxy for functional  improvement Baseline: 48% function Goal status: INITIAL    2.  Pt will self report back and R LE pain no greater than 3/10 for improved comfort and functional ability Baseline: 10/10 at worst Goal status: INITIAL    3.  Pt will increase 30 Second Sit to Stand rep count to no less than 10 reps for improved balance, strength, and functional mobility Baseline: 8 reps (MCID 2 reps) Goal status: INITIAL    4.  Pt will be able to navigate 15 stairs with reciprocal gait and no increase in pain for improved home navigation and functional mobility Baseline: unable Goal status: INITIAL   PLAN:   PT FREQUENCY: 2x/week   PT DURATION: 8 weeks   PLANNED INTERVENTIONS: Therapeutic exercises, Therapeutic activity, Neuromuscular re-education, Balance training, Gait training, Patient/Family education, Self Care, Joint mobilization, Dry Needling, Electrical stimulation, Cryotherapy, Moist heat, Vasopneumatic device, Manual therapy, and Re-evaluation.   PLAN  FOR NEXT SESSION: assess HEP response, progress core strength, hip flexor stretching   Lanice Shirts, PT 03/16/2023, 10:49 AM

## 2023-03-16 ENCOUNTER — Ambulatory Visit: Payer: Medicare Other

## 2023-03-16 DIAGNOSIS — M5459 Other low back pain: Secondary | ICD-10-CM

## 2023-03-16 DIAGNOSIS — M6281 Muscle weakness (generalized): Secondary | ICD-10-CM

## 2023-03-16 DIAGNOSIS — R2689 Other abnormalities of gait and mobility: Secondary | ICD-10-CM

## 2023-03-25 ENCOUNTER — Ambulatory Visit: Payer: Medicare Other | Attending: Family Medicine

## 2023-03-25 DIAGNOSIS — R2689 Other abnormalities of gait and mobility: Secondary | ICD-10-CM | POA: Insufficient documentation

## 2023-03-25 DIAGNOSIS — M6281 Muscle weakness (generalized): Secondary | ICD-10-CM | POA: Insufficient documentation

## 2023-03-25 DIAGNOSIS — M5459 Other low back pain: Secondary | ICD-10-CM | POA: Insufficient documentation

## 2023-03-25 NOTE — Therapy (Signed)
OUTPATIENT PHYSICAL THERAPY TREATMENT NOTE   Patient Name: Emma Hampton MRN: [id number] DOB:1956/04/07, 67 y.o., female Today's Date: 03/25/2023  PCP: Farrel Conners, MD  REFERRING PROVIDER: Farrel Conners, MD   END OF SESSION:   PT End of Session - 03/25/23 1220     Visit Number 3    Number of Visits 17    Date for PT Re-Evaluation 05/06/23    Authorization Type Medicare    PT Start Time 1220    PT Stop Time 1258    PT Time Calculation (min) 38 min    Activity Tolerance Patient tolerated treatment well    Behavior During Therapy WFL for tasks assessed/performed              Past Medical History:  Diagnosis Date   Diabetes mellitus without complication    Fibroids    Retroperitoneal abscess    Past Surgical History:  Procedure Laterality Date   BREAST BIOPSY Right    CESAREAN SECTION     CYST EXCISION     TUBAL LIGATION     Patient Active Problem List   Diagnosis Date Noted   Prediabetes 08/01/2022   Allergy to bee sting 07/31/2022   Postural dizziness with presyncope 07/31/2022   Meningitis, viral 09/10/2013    REFERRING DIAG: M48.26 (ICD-10-CM) - Baastrup disease of lumbar spine M54.50 (ICD-10-CM) - Acute left-sided low back pain without sciatica M79.604 (ICD-10-CM) - Acute leg pain, right  THERAPY DIAG:  Other low back pain - Plan: PT plan of care cert/re-cert   Muscle weakness (generalized) - Plan: PT plan of care cert/re-cert   Other abnormalities of gait and mobility - Plan: PT plan of care cert/re-cert  Rationale for Evaluation and Treatment Rehabilitation  PERTINENT HISTORY: DM II   PRECAUTIONS: scoliosis and "kissing" spine   SUBJECTIVE:                                                                                                                                                                                      SUBJECTIVE STATEMENT:  Patient reports that her pain is lower today and that she saw the spine specialist  who stated that he did not think that she needed to see him. She was also told that she was incorrectly diagnosed with baastrup disease "kissing spines" and that she should not have been diagnosed with this.    PAIN:  Are you having pain? Yes: NPRS scale: 6/10 Pain location: low back Pain description: ache Aggravating factors: undetermined Relieving factors: pain patches   OBJECTIVE: (objective measures completed at initial evaluation unless otherwise dated)   DIAGNOSTIC FINDINGS:  CLINICAL DATA:  Back pain.  EXAM: LUMBAR SPINE - 2-3 VIEW   COMPARISON:  Prior study from 2015   FINDINGS: Significant left convex lumbar scoliosis and associated degenerative lumbar spondylosis with lower lumbar disc disease and facet disease. The spinous processes are also enlarged and appear to be touching suggesting Basstrup's disease. No acute bony findings or destructive bony changes. Vascular calcifications are noted. Radiodensity in the left upper quadrant likely a pill in the stomach. The bony pelvis is intact. Mild hip and SI joint degenerative changes.   IMPRESSION: 1. Scoliosis and degenerative lumbar spondylosis with lower lumbar disc disease and facet disease. 2. Suspect Basstrup's disease. 3. No acute bony findings.   PATIENT SURVEYS:  FOTO: 48% function; 65% predicted    COGNITION: Overall cognitive status: Within functional limits for tasks assessed                          SENSATION: WFL   MUSCLE LENGTH: Thomas test: Right (+); Left (+)   POSTURE: increased lumbar lordosis   PALPATION: Increased tone and TTP to bilateral lumbar paraspinals   LUMBAR ROM:    AROM eval  Flexion Decreased; slight pain  Extension Decreased; very painful  Right lateral flexion    Left lateral flexion    Right rotation    Left rotation     (Blank rows = not tested)   LOWER EXTREMITY MMT:     MMT Right eval Left eval  Hip flexion 4/5 4/5  Hip extension      Hip abduction  3+/5 3+/5  Hip adduction      Hip internal rotation      Hip external rotation      Knee flexion      Knee extension      Ankle dorsiflexion      Ankle plantarflexion      Ankle inversion      Ankle eversion       (Blank rows = not tested)   LUMBAR SPECIAL TESTS:  Straight leg raise test: Positive and Slump test: Positive   FUNCTIONAL TESTS:  30 Second Sit to Stand: 8 reps   GAIT: Distance walked: 60ft Assistive device utilized: None Level of assistance: Complete Independence Comments: antalgic gait R, decreased gait speed   TREATMENT: [facility] Adult PT Treatment:                                                DATE: 03/25/23 Therapeutic Exercise:  Nustep L4 6 min Seated sciatic nerve glide x 5 B Modified thomas stretch x1' Bil Supine PPT x 10 5" hold Supine march 10/10 LTR x 10 - 5" hold Heels on pball DKTC x15 Supine hip fallouts RTB 10x B, 10/10 unilateral 90/90 30s x2 Sidelying clamshells x10 BIL  [facility] Adult PT Treatment:                                                DATE: 03/16/23 Therapeutic Exercise: Nustep L2 6 min Seated sciatic nerve glide x 5 B Modified thomas stretch 30s x2 Bil Supine PPT x 10 5" hold Supine march 10/10 LTR x 5 - 5" hold DKTC x 30" hold Supine hip fallouts RTB 10x B, 10/10 unilateral 90/90  66s x2  [facility] Adult PT Treatment:                                                DATE: 03/11/2023 Therapeutic Exercise: Seated sciatic nerve glide x 5 R Modified thomas stretch x 30" each Supine PPT x 5 - 5" hold LTR x 5 - 5" hold DKTC x 30" hold   PATIENT EDUCATION:  Education details: eval findings, FOTO, HEP, POC Person educated: Patient Education method: Explanation, Demonstration, and Handouts Education comprehension: verbalized understanding and returned demonstration   HOME EXERCISE PROGRAM: Access Code: [email or web address] URL: [email or web address] Date: 03/25/2023 Prepared by: Margarette Canada  Exercises - Seated Sciatic  Tensioner  - 1-2 x daily - 7 x weekly - 2 sets - 10 reps - Modified Thomas Stretch  - 1-2 x daily - 7 x weekly - 2 reps - 30 sec hold - Supine Posterior Pelvic Tilt  - 1-2 x daily - 7 x weekly - 2 sets - 10 reps - 5 sec hold - Supine Lower Trunk Rotation  - 1-2 x daily - 7 x weekly - 2 sets - 10 reps - 5 sec hold - Supine Double Knee to Chest  - 1-2 x daily - 7 x weekly - 2-3 reps - 30 sec hold - Hooklying Single Leg Bent Knee Fallouts with Resistance  - 1 x daily - 5 x weekly - 3 sets - 10 reps - 30s hold - Hooklying Single Leg Bent Knee Fallouts with Resistance  - 1 x daily - 5 x weekly - 2 sets - 10 reps    ASSESSMENT:   CLINICAL IMPRESSION:  Patient presents to PT reporting decreased lower back pain today and states that the spine specialist informed her the baastrup disease diagnosis was incorrect and that she did not need to establish care with them. Session today continued to focus on proximal hip and core strengthening as well as stretching for hip flexors. Patient was able to tolerate all prescribed exercises with no adverse effects. Patient continues to benefit from skilled PT services and should be progressed as able to improve functional independence.     OBJECTIVE IMPAIRMENTS: decreased activity tolerance, decreased mobility, difficulty walking, decreased ROM, decreased strength, postural dysfunction, and pain.    ACTIVITY LIMITATIONS: carrying, lifting, standing, squatting, and locomotion level   PARTICIPATION LIMITATIONS: meal prep, cleaning, driving, shopping, community activity, and yard work   PERSONAL FACTORS: Time since onset of injury/illness/exacerbation and 1-2 comorbidities: DM II  are also affecting patient's functional outcome.    REHAB POTENTIAL: Excellent   CLINICAL DECISION MAKING: Evolving/moderate complexity   EVALUATION COMPLEXITY: Moderate     GOALS: Goals reviewed with patient? No   SHORT TERM GOALS: Target date: 04/01/2023   Pt will be compliant and  knowledgeable with initial HEP for improved comfort and carryover Baseline: initial HEP given  Goal status: INITIAL   2.  Pt will self report back and R LE pain no greater than 6/10 for improved comfort and functional ability Baseline: 10/10 at worst Goal status: INITIAL    LONG TERM GOALS: Target date: 05/06/2023   Pt will improve FOTO function score to no less than 65% as proxy for functional improvement Baseline: 48% function Goal status: INITIAL    2.  Pt will self report back and R LE pain no greater than 3/10 for improved comfort  and functional ability Baseline: 10/10 at worst Goal status: INITIAL    3.  Pt will increase 30 Second Sit to Stand rep count to no less than 10 reps for improved balance, strength, and functional mobility Baseline: 8 reps (MCID 2 reps) Goal status: INITIAL    4.  Pt will be able to navigate 15 stairs with reciprocal gait and no increase in pain for improved home navigation and functional mobility Baseline: unable Goal status: INITIAL   PLAN:   PT FREQUENCY: 2x/week   PT DURATION: 8 weeks   PLANNED INTERVENTIONS: Therapeutic exercises, Therapeutic activity, Neuromuscular re-education, Balance training, Gait training, Patient/Family education, Self Care, Joint mobilization, Dry Needling, Electrical stimulation, Cryotherapy, Moist heat, Vasopneumatic device, Manual therapy, and Re-evaluation.   PLAN FOR NEXT SESSION: assess HEP response, progress core strength, hip flexor stretching   Margarette Canada, PTA 03/25/2023, 12:51 PM

## 2023-03-30 ENCOUNTER — Ambulatory Visit: Payer: Medicare Other

## 2023-03-30 DIAGNOSIS — M5459 Other low back pain: Secondary | ICD-10-CM

## 2023-03-30 DIAGNOSIS — R2689 Other abnormalities of gait and mobility: Secondary | ICD-10-CM

## 2023-03-30 DIAGNOSIS — M6281 Muscle weakness (generalized): Secondary | ICD-10-CM

## 2023-03-30 NOTE — Therapy (Signed)
OUTPATIENT PHYSICAL THERAPY TREATMENT NOTE   Patient Name: Emma Hampton MRN: [id number] DOB:[birth year], 67 y.o., female Today's Date: 03/30/2023  PCP: Karie Georges, MD  REFERRING PROVIDER: Karie Georges, MD   END OF SESSION:   PT End of Session - 03/30/23 1215     Visit Number 4    Number of Visits 17    Date for PT Re-Evaluation 05/06/23    Authorization Type Medicare    PT Start Time 1216    PT Stop Time 1255    PT Time Calculation (min) 39 min    Activity Tolerance Patient tolerated treatment well    Behavior During Therapy WFL for tasks assessed/performed               Past Medical History:  Diagnosis Date   Diabetes mellitus without complication    Fibroids    Retroperitoneal abscess    Past Surgical History:  Procedure Laterality Date   BREAST BIOPSY Right    CESAREAN SECTION     CYST EXCISION     TUBAL LIGATION     Patient Active Problem List   Diagnosis Date Noted   Prediabetes 08/01/2022   Allergy to bee sting 07/31/2022   Postural dizziness with presyncope 07/31/2022   Meningitis, viral 09/10/2013    REFERRING DIAG: M48.26 (ICD-10-CM) - Baastrup disease of lumbar spine M54.50 (ICD-10-CM) - Acute left-sided low back pain without sciatica M79.604 (ICD-10-CM) - Acute leg pain, right  THERAPY DIAG:  Other low back pain - Plan: PT plan of care cert/re-cert   Muscle weakness (generalized) - Plan: PT plan of care cert/re-cert   Other abnormalities of gait and mobility - Plan: PT plan of care cert/re-cert  Rationale for Evaluation and Treatment Rehabilitation  PERTINENT HISTORY: DM II   PRECAUTIONS: scoliosis and "kissing" spine   SUBJECTIVE:                                                                                                                                                                                      SUBJECTIVE STATEMENT:  Pt presents to PT with continued reports of improvement in symptoms. Has been  compliant with HEP with no adverse effect.    PAIN:  Are you having pain? Yes: NPRS scale: 6/10 Pain location: low back Pain description: ache Aggravating factors: undetermined Relieving factors: pain patches   OBJECTIVE: (objective measures completed at initial evaluation unless otherwise dated)   DIAGNOSTIC FINDINGS:  CLINICAL DATA:  Back pain.   EXAM: LUMBAR SPINE - 2-3 VIEW   COMPARISON:  Prior study from 2015   FINDINGS: Significant left convex lumbar scoliosis and associated degenerative lumbar spondylosis with lower  lumbar disc disease and facet disease. The spinous processes are also enlarged and appear to be touching suggesting Basstrup's disease. No acute bony findings or destructive bony changes. Vascular calcifications are noted. Radiodensity in the left upper quadrant likely a pill in the stomach. The bony pelvis is intact. Mild hip and SI joint degenerative changes.   IMPRESSION: 1. Scoliosis and degenerative lumbar spondylosis with lower lumbar disc disease and facet disease. 2. Suspect Basstrup's disease. 3. No acute bony findings.   PATIENT SURVEYS:  FOTO: 48% function; 65% predicted    COGNITION: Overall cognitive status: Within functional limits for tasks assessed                          SENSATION: WFL   MUSCLE LENGTH: Thomas test: Right (+); Left (+)   POSTURE: increased lumbar lordosis   PALPATION: Increased tone and TTP to bilateral lumbar paraspinals   LUMBAR ROM:    AROM eval  Flexion Decreased; slight pain  Extension Decreased; very painful  Right lateral flexion    Left lateral flexion    Right rotation    Left rotation     (Blank rows = not tested)   LOWER EXTREMITY MMT:     MMT Right eval Left eval  Hip flexion 4/5 4/5  Hip extension      Hip abduction 3+/5 3+/5  Hip adduction      Hip internal rotation      Hip external rotation      Knee flexion      Knee extension      Ankle dorsiflexion      Ankle  plantarflexion      Ankle inversion      Ankle eversion       (Blank rows = not tested)   LUMBAR SPECIAL TESTS:  Straight leg raise test: Positive and Slump test: Positive   FUNCTIONAL TESTS:  30 Second Sit to Stand: 8 reps   GAIT: Distance walked: 34ft Assistive device utilized: None Level of assistance: Complete Independence Comments: antalgic gait R, decreased gait speed   TREATMENT: [facility] Adult PT Treatment:                                                DATE: 03/30/23 Therapeutic Exercise:  Nustep L5 x 4 min while taking subjective Modified thomas stretch x 1' Bil Supine PPT x 10 - 5" hold Supine PPT with ball x 10 Supine march 10/10 Supine glute set x 10 - 5" hold LTR x 10 - 5" hold 90/90 30s x2 Supine SLR with pilates ring x 10 Lateral walk RTB x 2 laps at counter Standing hip ext x 10 RTB  [facility] Adult PT Treatment:                                                DATE: 03/25/23 Therapeutic Exercise:  Nustep L4 6 min Seated sciatic nerve glide x 5 B Modified thomas stretch x1' Bil Supine PPT x 10 5" hold Supine march 10/10 LTR x 10 - 5" hold Heels on pball DKTC x15 Supine hip fallouts RTB 10x B, 10/10 unilateral 90/90 30s x2 Sidelying clamshells x10 BIL  [facility] Adult PT Treatment:  DATE: 03/16/23 Therapeutic Exercise: Nustep L2 6 min Seated sciatic nerve glide x 5 B Modified thomas stretch 30s x2 Bil Supine PPT x 10 5" hold Supine march 10/10 LTR x 5 - 5" hold DKTC x 30" hold Supine hip fallouts RTB 10x B, 10/10 unilateral 90/90 30s x2  [facility] Adult PT Treatment:                                                DATE: 03/11/2023 Therapeutic Exercise: Seated sciatic nerve glide x 5 R Modified thomas stretch x 30" each Supine PPT x 5 - 5" hold LTR x 5 - 5" hold DKTC x 30" hold   PATIENT EDUCATION:  Education details: eval findings, FOTO, HEP, POC Person educated: Patient Education method: Explanation, Demonstration,  and Handouts Education comprehension: verbalized understanding and returned demonstration   HOME EXERCISE PROGRAM: Access Code: [email or web address] URL: [email or web address] Date: 03/25/2023 Prepared by: Berta Minor  Exercises - Seated Sciatic Tensioner  - 1-2 x daily - 7 x weekly - 2 sets - 10 reps - Modified Thomas Stretch  - 1-2 x daily - 7 x weekly - 2 reps - 30 sec hold - Supine Posterior Pelvic Tilt  - 1-2 x daily - 7 x weekly - 2 sets - 10 reps - 5 sec hold - Supine Lower Trunk Rotation  - 1-2 x daily - 7 x weekly - 2 sets - 10 reps - 5 sec hold - Supine Double Knee to Chest  - 1-2 x daily - 7 x weekly - 2-3 reps - 30 sec hold - Hooklying Single Leg Bent Knee Fallouts with Resistance  - 1 x daily - 5 x weekly - 3 sets - 10 reps - 30s hold - Hooklying Single Leg Bent Knee Fallouts with Resistance  - 1 x daily - 5 x weekly - 2 sets - 10 reps    ASSESSMENT:   CLINICAL IMPRESSION:  Pt was able to complete all prescribed exercises with no adverse effect or increase in pain. Therapy focused on improving core and proximal hip strength in order to decrease pain and improve comfort. Pt progressing well with therapy, will continue per POC as prescribed,    OBJECTIVE IMPAIRMENTS: decreased activity tolerance, decreased mobility, difficulty walking, decreased ROM, decreased strength, postural dysfunction, and pain.    ACTIVITY LIMITATIONS: carrying, lifting, standing, squatting, and locomotion level   PARTICIPATION LIMITATIONS: meal prep, cleaning, driving, shopping, community activity, and yard work   PERSONAL FACTORS: Time since onset of injury/illness/exacerbation and 1-2 comorbidities: DM II  are also affecting patient's functional outcome.    REHAB POTENTIAL: Excellent   CLINICAL DECISION MAKING: Evolving/moderate complexity   EVALUATION COMPLEXITY: Moderate     GOALS: Goals reviewed with patient? No   SHORT TERM GOALS: Target date: 04/01/2023   Pt will be  compliant and knowledgeable with initial HEP for improved comfort and carryover Baseline: initial HEP given  Goal status: INITIAL   2.  Pt will self report back and R LE pain no greater than 6/10 for improved comfort and functional ability Baseline: 10/10 at worst Goal status: INITIAL    LONG TERM GOALS: Target date: 05/06/2023   Pt will improve FOTO function score to no less than 65% as proxy for functional improvement Baseline: 48% function Goal status: INITIAL    2.  Pt will self report back and R  LE pain no greater than 3/10 for improved comfort and functional ability Baseline: 10/10 at worst Goal status: INITIAL    3.  Pt will increase 30 Second Sit to Stand rep count to no less than 10 reps for improved balance, strength, and functional mobility Baseline: 8 reps (MCID 2 reps) Goal status: INITIAL    4.  Pt will be able to navigate 15 stairs with reciprocal gait and no increase in pain for improved home navigation and functional mobility Baseline: unable Goal status: INITIAL   PLAN:   PT FREQUENCY: 2x/week   PT DURATION: 8 weeks   PLANNED INTERVENTIONS: Therapeutic exercises, Therapeutic activity, Neuromuscular re-education, Balance training, Gait training, Patient/Family education, Self Care, Joint mobilization, Dry Needling, Electrical stimulation, Cryotherapy, Moist heat, Vasopneumatic device, Manual therapy, and Re-evaluation.   PLAN FOR NEXT SESSION: assess HEP response, progress core strength, hip flexor stretching   Eloy Endavid C Hartlee Amedee, PT 03/30/2023, 12:56 PM

## 2023-03-31 NOTE — Therapy (Unsigned)
OUTPATIENT PHYSICAL THERAPY TREATMENT NOTE   Patient Name: Emma Hampton MRN: [id number] DOB:December 08, 1956, 67 y.o., female Today's Date: 03/31/2023  PCP: Karie GeorgesMichael, Barbara M, MD  REFERRING PROVIDER: Karie GeorgesMichael, Barbara M, MD   END OF SESSION:       Past Medical History:  Diagnosis Date   Diabetes mellitus without complication    Fibroids    Retroperitoneal abscess    Past Surgical History:  Procedure Laterality Date   BREAST BIOPSY Right    CESAREAN SECTION     CYST EXCISION     TUBAL LIGATION     Patient Active Problem List   Diagnosis Date Noted   Prediabetes 08/01/2022   Allergy to bee sting 07/31/2022   Postural dizziness with presyncope 07/31/2022   Meningitis, viral 09/10/2013    REFERRING DIAG: M48.26 (ICD-10-CM) - Baastrup disease of lumbar spine M54.50 (ICD-10-CM) - Acute left-sided low back pain without sciatica M79.604 (ICD-10-CM) - Acute leg pain, right  THERAPY DIAG:  Other low back pain - Plan: PT plan of care cert/re-cert   Muscle weakness (generalized) - Plan: PT plan of care cert/re-cert   Other abnormalities of gait and mobility - Plan: PT plan of care cert/re-cert  Rationale for Evaluation and Treatment Rehabilitation  PERTINENT HISTORY: DM II   PRECAUTIONS: scoliosis and "kissing" spine   SUBJECTIVE:                                                                                                                                                                                      SUBJECTIVE STATEMENT:  Pt presents to PT with continued reports of improvement in symptoms. Has been compliant with HEP with no adverse effect.    PAIN:  Are you having pain? Yes: NPRS scale: 6/10 Pain location: low back Pain description: ache Aggravating factors: undetermined Relieving factors: pain patches   OBJECTIVE: (objective measures completed at initial evaluation unless otherwise dated)   DIAGNOSTIC FINDINGS:  CLINICAL DATA:  Back pain.    EXAM: LUMBAR SPINE - 2-3 VIEW   COMPARISON:  Prior study from 2015   FINDINGS: Significant left convex lumbar scoliosis and associated degenerative lumbar spondylosis with lower lumbar disc disease and facet disease. The spinous processes are also enlarged and appear to be touching suggesting Basstrup's disease. No acute bony findings or destructive bony changes. Vascular calcifications are noted. Radiodensity in the left upper quadrant likely a pill in the stomach. The bony pelvis is intact. Mild hip and SI joint degenerative changes.   IMPRESSION: 1. Scoliosis and degenerative lumbar spondylosis with lower lumbar disc disease and facet disease. 2. Suspect Basstrup's disease. 3. No acute bony findings.   PATIENT SURVEYS:  FOTO: 48% function; 65% predicted    COGNITION: Overall cognitive status: Within functional limits for tasks assessed                          SENSATION: [facility]   MUSCLE LENGTH: Thomas test: Right (+); Left (+)   POSTURE: increased lumbar lordosis   PALPATION: Increased tone and TTP to bilateral lumbar paraspinals   LUMBAR ROM:    AROM eval  Flexion Decreased; slight pain  Extension Decreased; very painful  Right lateral flexion    Left lateral flexion    Right rotation    Left rotation     (Blank rows = not tested)   LOWER EXTREMITY MMT:     MMT Right eval Left eval  Hip flexion 4/5 4/5  Hip extension      Hip abduction 3+/5 3+/5  Hip adduction      Hip internal rotation      Hip external rotation      Knee flexion      Knee extension      Ankle dorsiflexion      Ankle plantarflexion      Ankle inversion      Ankle eversion       (Blank rows = not tested)   LUMBAR SPECIAL TESTS:  Straight leg raise test: Positive and Slump test: Positive   FUNCTIONAL TESTS:  30 Second Sit to Stand: 8 reps   GAIT: Distance walked: 36ft Assistive device utilized: None Level of assistance: Complete Independence Comments: antalgic gait R,  decreased gait speed   TREATMENT: [facility] Adult PT Treatment:                                                DATE: 03/30/23 Therapeutic Exercise:  Nustep L5 x 4 min while taking subjective Modified thomas stretch x 1' Bil Supine PPT x 10 - 5" hold Supine PPT with ball x 10 Supine march 10/10 Supine glute set x 10 - 5" hold LTR x 10 - 5" hold 90/90 30s x2 Supine SLR with pilates ring x 10 Lateral walk RTB x 2 laps at counter Standing hip ext x 10 RTB  [facility] Adult PT Treatment:                                                DATE: 03/25/23 Therapeutic Exercise:  Nustep L4 6 min Seated sciatic nerve glide x 5 B Modified thomas stretch x1' Bil Supine PPT x 10 5" hold Supine march 10/10 LTR x 10 - 5" hold Heels on pball DKTC x15 Supine hip fallouts RTB 10x B, 10/10 unilateral 90/90 30s x2 Sidelying clamshells x10 BIL  [facility] Adult PT Treatment:                                                DATE: 03/16/23 Therapeutic Exercise: Nustep L2 6 min Seated sciatic nerve glide x 5 B Modified thomas stretch 30s x2 Bil Supine PPT x 10 5" hold Supine march 10/10 LTR x 5 - 5" hold DKTC x 30" hold  Supine hip fallouts RTB 10x B, 10/10 unilateral 90/90 30s x2  [facility] Adult PT Treatment:                                                DATE: 03/11/2023 Therapeutic Exercise: Seated sciatic nerve glide x 5 R Modified thomas stretch x 30" each Supine PPT x 5 - 5" hold LTR x 5 - 5" hold DKTC x 30" hold   PATIENT EDUCATION:  Education details: eval findings, FOTO, HEP, POC Person educated: Patient Education method: Explanation, Demonstration, and Handouts Education comprehension: verbalized understanding and returned demonstration   HOME EXERCISE PROGRAM: Access Code: [email or web address] URL: [email or web address] Date: 03/25/2023 Prepared by: Berta Minor  Exercises - Seated Sciatic Tensioner  - 1-2 x daily - 7 x weekly - 2 sets - 10 reps - Modified Thomas Stretch  - 1-2 x daily - 7 x  weekly - 2 reps - 30 sec hold - Supine Posterior Pelvic Tilt  - 1-2 x daily - 7 x weekly - 2 sets - 10 reps - 5 sec hold - Supine Lower Trunk Rotation  - 1-2 x daily - 7 x weekly - 2 sets - 10 reps - 5 sec hold - Supine Double Knee to Chest  - 1-2 x daily - 7 x weekly - 2-3 reps - 30 sec hold - Hooklying Single Leg Bent Knee Fallouts with Resistance  - 1 x daily - 5 x weekly - 3 sets - 10 reps - 30s hold - Hooklying Single Leg Bent Knee Fallouts with Resistance  - 1 x daily - 5 x weekly - 2 sets - 10 reps    ASSESSMENT:   CLINICAL IMPRESSION:  Pt was able to complete all prescribed exercises with no adverse effect or increase in pain. Therapy focused on improving core and proximal hip strength in order to decrease pain and improve comfort. Pt progressing well with therapy, will continue per POC as prescribed,    OBJECTIVE IMPAIRMENTS: decreased activity tolerance, decreased mobility, difficulty walking, decreased ROM, decreased strength, postural dysfunction, and pain.    ACTIVITY LIMITATIONS: carrying, lifting, standing, squatting, and locomotion level   PARTICIPATION LIMITATIONS: meal prep, cleaning, driving, shopping, community activity, and yard work   PERSONAL FACTORS: Time since onset of injury/illness/exacerbation and 1-2 comorbidities: DM II  are also affecting patient's functional outcome.    REHAB POTENTIAL: Excellent   CLINICAL DECISION MAKING: Evolving/moderate complexity   EVALUATION COMPLEXITY: Moderate     GOALS: Goals reviewed with patient? No   SHORT TERM GOALS: Target date: 04/01/2023   Pt will be compliant and knowledgeable with initial HEP for improved comfort and carryover Baseline: initial HEP given  Goal status: INITIAL   2.  Pt will self report back and R LE pain no greater than 6/10 for improved comfort and functional ability Baseline: 10/10 at worst Goal status: INITIAL    LONG TERM GOALS: Target date: 05/06/2023   Pt will improve FOTO function score  to no less than 65% as proxy for functional improvement Baseline: 48% function Goal status: INITIAL    2.  Pt will self report back and R LE pain no greater than 3/10 for improved comfort and functional ability Baseline: 10/10 at worst Goal status: INITIAL    3.  Pt will increase 30 Second Sit to Stand rep count to no less than 10 reps  for improved balance, strength, and functional mobility Baseline: 8 reps (MCID 2 reps) Goal status: INITIAL    4.  Pt will be able to navigate 15 stairs with reciprocal gait and no increase in pain for improved home navigation and functional mobility Baseline: unable Goal status: INITIAL   PLAN:   PT FREQUENCY: 2x/week   PT DURATION: 8 weeks   PLANNED INTERVENTIONS: Therapeutic exercises, Therapeutic activity, Neuromuscular re-education, Balance training, Gait training, Patient/Family education, Self Care, Joint mobilization, Dry Needling, Electrical stimulation, Cryotherapy, Moist heat, Vasopneumatic device, Manual therapy, and Re-evaluation.   PLAN FOR NEXT SESSION: assess HEP response, progress core strength, hip flexor stretching   Hildred Laser, PT 03/31/2023, 10:51 AM

## 2023-04-01 ENCOUNTER — Ambulatory Visit: Payer: Medicare Other

## 2023-04-01 DIAGNOSIS — M5459 Other low back pain: Secondary | ICD-10-CM

## 2023-04-01 DIAGNOSIS — R2689 Other abnormalities of gait and mobility: Secondary | ICD-10-CM

## 2023-04-01 DIAGNOSIS — M6281 Muscle weakness (generalized): Secondary | ICD-10-CM

## 2023-04-07 ENCOUNTER — Ambulatory Visit: Payer: Medicare Other

## 2023-04-07 DIAGNOSIS — M5459 Other low back pain: Secondary | ICD-10-CM

## 2023-04-07 DIAGNOSIS — M6281 Muscle weakness (generalized): Secondary | ICD-10-CM

## 2023-04-07 NOTE — Therapy (Signed)
OUTPATIENT PHYSICAL THERAPY TREATMENT NOTE   Patient Name: Emma Hampton MRN: [id number] DOB:December 10, 1956, 67 y.o., female Today's Date: 04/07/2023  PCP: Karie Georges, MD  REFERRING PROVIDER: Karie Georges, MD   END OF SESSION:   PT End of Session - 04/07/23 1215     Visit Number 6    Number of Visits 17    Date for PT Re-Evaluation 05/06/23    Authorization Type Medicare    Progress Note Due on Visit 10    PT Start Time 1215    PT Stop Time 1254    PT Time Calculation (min) 39 min    Activity Tolerance Patient tolerated treatment well    Behavior During Therapy WFL for tasks assessed/performed                 Past Medical History:  Diagnosis Date   Diabetes mellitus without complication    Fibroids    Retroperitoneal abscess    Past Surgical History:  Procedure Laterality Date   BREAST BIOPSY Right    CESAREAN SECTION     CYST EXCISION     TUBAL LIGATION     Patient Active Problem List   Diagnosis Date Noted   Prediabetes 08/01/2022   Allergy to bee sting 07/31/2022   Postural dizziness with presyncope 07/31/2022   Meningitis, viral 09/10/2013    REFERRING DIAG: M48.26 (ICD-10-CM) - Baastrup disease of lumbar spine M54.50 (ICD-10-CM) - Acute left-sided low back pain without sciatica M79.604 (ICD-10-CM) - Acute leg pain, right  THERAPY DIAG:  Other low back pain - Plan: PT plan of care cert/re-cert   Muscle weakness (generalized) - Plan: PT plan of care cert/re-cert   Other abnormalities of gait and mobility - Plan: PT plan of care cert/re-cert  Rationale for Evaluation and Treatment Rehabilitation  PERTINENT HISTORY: DM II   PRECAUTIONS: scoliosis and "kissing" spine   SUBJECTIVE:                                                                                                                                                                                      SUBJECTIVE STATEMENT:  Pt presents to PT with reports of slight  continuation of lower back pain, although it is still decreasing. Has been compliant with HEP with no adverse effect.    PAIN:  Are you having pain? Yes: NPRS scale: 5/10 Pain location: low back Pain description: ache Aggravating factors: undetermined Relieving factors: pain patches   OBJECTIVE: (objective measures completed at initial evaluation unless otherwise dated)   DIAGNOSTIC FINDINGS:  CLINICAL DATA:  Back pain.   EXAM: LUMBAR SPINE - 2-3 VIEW   COMPARISON:  Prior  study from 2015   FINDINGS: Significant left convex lumbar scoliosis and associated degenerative lumbar spondylosis with lower lumbar disc disease and facet disease. The spinous processes are also enlarged and appear to be touching suggesting Basstrup's disease. No acute bony findings or destructive bony changes. Vascular calcifications are noted. Radiodensity in the left upper quadrant likely a pill in the stomach. The bony pelvis is intact. Mild hip and SI joint degenerative changes.   IMPRESSION: 1. Scoliosis and degenerative lumbar spondylosis with lower lumbar disc disease and facet disease. 2. Suspect Basstrup's disease. 3. No acute bony findings.   PATIENT SURVEYS:  FOTO: 48% function; 65% predicted  04/07/2023: 70% function   COGNITION: Overall cognitive status: Within functional limits for tasks assessed                          SENSATION: [facility]   MUSCLE LENGTH: Thomas test: Right (+); Left (+)   POSTURE: increased lumbar lordosis   PALPATION: Increased tone and TTP to bilateral lumbar paraspinals   LUMBAR ROM:    AROM eval  Flexion Decreased; slight pain  Extension Decreased; very painful  Right lateral flexion    Left lateral flexion    Right rotation    Left rotation     (Blank rows = not tested)   LOWER EXTREMITY MMT:     MMT Right eval Left eval  Hip flexion 4/5 4/5  Hip extension      Hip abduction 3+/5 3+/5  Hip adduction      Hip internal rotation      Hip  external rotation      Knee flexion      Knee extension      Ankle dorsiflexion      Ankle plantarflexion      Ankle inversion      Ankle eversion       (Blank rows = not tested)   LUMBAR SPECIAL TESTS:  Straight leg raise test: Positive and Slump test: Positive   FUNCTIONAL TESTS:  30 Second Sit to Stand: 8 reps   GAIT: Distance walked: 61ft Assistive device utilized: None Level of assistance: Complete Independence Comments: antalgic gait R, decreased gait speed   TREATMENT: [facility] Adult PT Treatment:                                                DATE: 04/07/23 Therapeutic Exercise:  Nustep L5 x 4 min while taking subjective Supine PPT x 10 - 5" hold 90/90 hold 2x30" Bridge 2x10 (small range) LTR x 10 - 5" hold Supine SLR with pilates ring x 10 STS with OH press 2x10 5# Lateral walk RTB x 3 laps at counter Standing hip abd/ext x 10 RTB Pallof press 2x10 GTB  [facility] Adult PT Treatment:                                                DATE: 04/01/23 Therapeutic Exercise: Nustep L4 x 8 min while taking subjective QL stretch 30s x2 Bil Open book 10/10 Modified thomas stretch x 60s Bil with bolster Supine PPT x 10 - 5" hold Supine PPT with alternating march, 15x 90/90 30s x2 Supine hip fallouts 15x Bil,  15/15 unilaterally GTB  [facility] Adult PT Treatment:                                                DATE: 03/30/23 Therapeutic Exercise:  Nustep L5 x 4 min while taking subjective Modified thomas stretch x 1' Bil Supine PPT x 10 - 5" hold Supine PPT with ball x 10 Supine march 10/10 Supine glute set x 10 - 5" hold LTR x 10 - 5" hold 90/90 30s x2 Supine SLR with pilates ring x 10 Lateral walk RTB x 2 laps at counter Standing hip ext x 10 RTB  [facility] Adult PT Treatment:                                                DATE: 03/25/23 Therapeutic Exercise:  Nustep L4 6 min Seated sciatic nerve glide x 5 B Modified thomas stretch x1' Bil Supine PPT x 10 5" hold Supine march  10/10 LTR x 10 - 5" hold Heels on pball DKTC x15 Supine hip fallouts RTB 10x B, 10/10 unilateral 90/90 30s x2 Sidelying clamshells x10 BIL   PATIENT EDUCATION:  Education details: continue HEP Person educated: Patient Education method: Explanation, Demonstration, and Handouts Education comprehension: verbalized understanding and returned demonstration   HOME EXERCISE PROGRAM: Access Code: [email or web address] URL: [email or web address] Date: 04/01/2023 Prepared by: Gustavus Bryant  Exercises - Seated Sciatic Tensioner  - 1-2 x daily - 7 x weekly - 2 sets - 10 reps - Modified Thomas Stretch  - 1-2 x daily - 7 x weekly - 2 reps - 30 sec hold - Supine Posterior Pelvic Tilt  - 1-2 x daily - 7 x weekly - 2 sets - 10 reps - 5 sec hold - Supine Double Knee to Chest  - 1-2 x daily - 7 x weekly - 2-3 reps - 30 sec hold - Hooklying Single Leg Bent Knee Fallouts with Resistance  - 1 x daily - 5 x weekly - 2 sets - 10 reps - Supine Quadratus Lumborum Stretch  - 1 x daily - 5 x weekly - 1 sets - 30s hold - Curl Up with Reach  - 1 x daily - 5 x weekly - 1 sets - 15 reps   ASSESSMENT:   CLINICAL IMPRESSION: Pt was able to complete all prescribed exercises with no adverse effect or increase in pain. Therapy focused on improving core and proximal hip strength in order to decrease pain and improve comfort. She demonstrated increase in subjective functional ability assessed today via FOTO, meeting LTG. Pt progressing well with therapy, will continue per POC as prescribed.   OBJECTIVE IMPAIRMENTS: decreased activity tolerance, decreased mobility, difficulty walking, decreased ROM, decreased strength, postural dysfunction, and pain.    ACTIVITY LIMITATIONS: carrying, lifting, standing, squatting, and locomotion level   PARTICIPATION LIMITATIONS: meal prep, cleaning, driving, shopping, community activity, and yard work   PERSONAL FACTORS: Time since onset of injury/illness/exacerbation and 1-2  comorbidities: DM II  are also affecting patient's functional outcome.     GOALS: Goals reviewed with patient? No   SHORT TERM GOALS: Target date: 04/01/2023   Pt will be compliant and knowledgeable with initial HEP for improved comfort and carryover Baseline: initial HEP given  Goal  status: MET   2.  Pt will self report back and R LE pain no greater than 6/10 for improved comfort and functional ability Baseline: 10/10 at worst Goal status: IMET   LONG TERM GOALS: Target date: 05/06/2023   Pt will improve FOTO function score to no less than 65% as proxy for functional improvement Baseline: 48% function 04/07/2023: 70% function Goal status: MET   2.  Pt will self report back and R LE pain no greater than 3/10 for improved comfort and functional ability Baseline: 10/10 at worst Goal status: INITIAL    3.  Pt will increase 30 Second Sit to Stand rep count to no less than 10 reps for improved balance, strength, and functional mobility Baseline: 8 reps (MCID 2 reps) Goal status: INITIAL    4.  Pt will be able to navigate 15 stairs with reciprocal gait and no increase in pain for improved home navigation and functional mobility Baseline: unable Goal status: INITIAL   PLAN:   PT FREQUENCY: 2x/week   PT DURATION: 8 weeks   PLANNED INTERVENTIONS: Therapeutic exercises, Therapeutic activity, Neuromuscular re-education, Balance training, Gait training, Patient/Family education, Self Care, Joint mobilization, Dry Needling, Electrical stimulation, Cryotherapy, Moist heat, Vasopneumatic device, Manual therapy, and Re-evaluation.   PLAN FOR NEXT SESSION: assess HEP response, progress core strength, hip flexor stretching   Eloy End, PT 04/07/2023, 12:54 PM

## 2023-04-09 ENCOUNTER — Ambulatory Visit: Payer: Medicare Other

## 2023-04-09 DIAGNOSIS — R2689 Other abnormalities of gait and mobility: Secondary | ICD-10-CM

## 2023-04-09 DIAGNOSIS — M6281 Muscle weakness (generalized): Secondary | ICD-10-CM

## 2023-04-09 DIAGNOSIS — M5459 Other low back pain: Secondary | ICD-10-CM | POA: Diagnosis not present

## 2023-04-09 NOTE — Therapy (Signed)
OUTPATIENT PHYSICAL THERAPY TREATMENT NOTE   Patient Name: Emma Hampton MRN: [id number] DOB:[birth year], 67 y.o., female Today's Date: 04/09/2023  PCP: Karie Georges, MD  REFERRING PROVIDER: Karie Georges, MD   END OF SESSION:   PT End of Session - 04/09/23 1215     Visit Number 7    Number of Visits 17    Date for PT Re-Evaluation 05/06/23    Authorization Type Medicare    Progress Note Due on Visit 10    PT Start Time 1215    PT Stop Time 1253    PT Time Calculation (min) 38 min    Activity Tolerance Patient tolerated treatment well    Behavior During Therapy WFL for tasks assessed/performed                  Past Medical History:  Diagnosis Date   Diabetes mellitus without complication    Fibroids    Retroperitoneal abscess    Past Surgical History:  Procedure Laterality Date   BREAST BIOPSY Right    CESAREAN SECTION     CYST EXCISION     TUBAL LIGATION     Patient Active Problem List   Diagnosis Date Noted   Prediabetes 08/01/2022   Allergy to bee sting 07/31/2022   Postural dizziness with presyncope 07/31/2022   Meningitis, viral 09/10/2013    REFERRING DIAG: M48.26 (ICD-10-CM) - Baastrup disease of lumbar spine M54.50 (ICD-10-CM) - Acute left-sided low back pain without sciatica M79.604 (ICD-10-CM) - Acute leg pain, right  THERAPY DIAG:  Other low back pain - Plan: PT plan of care cert/re-cert   Muscle weakness (generalized) - Plan: PT plan of care cert/re-cert   Other abnormalities of gait and mobility - Plan: PT plan of care cert/re-cert  Rationale for Evaluation and Treatment Rehabilitation  PERTINENT HISTORY: DM II   PRECAUTIONS: scoliosis and "kissing" spine   SUBJECTIVE:                                                                                                                                                                                      SUBJECTIVE STATEMENT:  Pt presents to PT with no current reports  of pain. Has been compliant with HEP with no adverse effect.    PAIN:  Are you having pain?  Yes: NPRS scale: 0/10 Pain location: low back Pain description: ache Aggravating factors: undetermined Relieving factors: pain patches   OBJECTIVE: (objective measures completed at initial evaluation unless otherwise dated)   DIAGNOSTIC FINDINGS:  CLINICAL DATA:  Back pain.   EXAM: LUMBAR SPINE - 2-3 VIEW   COMPARISON:  Prior study from 2015   FINDINGS:  Significant left convex lumbar scoliosis and associated degenerative lumbar spondylosis with lower lumbar disc disease and facet disease. The spinous processes are also enlarged and appear to be touching suggesting Basstrup's disease. No acute bony findings or destructive bony changes. Vascular calcifications are noted. Radiodensity in the left upper quadrant likely a pill in the stomach. The bony pelvis is intact. Mild hip and SI joint degenerative changes.   IMPRESSION: 1. Scoliosis and degenerative lumbar spondylosis with lower lumbar disc disease and facet disease. 2. Suspect Basstrup's disease. 3. No acute bony findings.   PATIENT SURVEYS:  FOTO: 48% function; 65% predicted  04/07/2023: 70% function   COGNITION: Overall cognitive status: Within functional limits for tasks assessed                          SENSATION: [facility]   MUSCLE LENGTH: Thomas test: Right (+); Left (+)   POSTURE: increased lumbar lordosis   PALPATION: Increased tone and TTP to bilateral lumbar paraspinals   LUMBAR ROM:    AROM eval  Flexion Decreased; slight pain  Extension Decreased; very painful  Right lateral flexion    Left lateral flexion    Right rotation    Left rotation     (Blank rows = not tested)   LOWER EXTREMITY MMT:     MMT Right eval Left eval  Hip flexion 4/5 4/5  Hip extension      Hip abduction 3+/5 3+/5  Hip adduction      Hip internal rotation      Hip external rotation      Knee flexion      Knee extension       Ankle dorsiflexion      Ankle plantarflexion      Ankle inversion      Ankle eversion       (Blank rows = not tested)   LUMBAR SPECIAL TESTS:  Straight leg raise test: Positive and Slump test: Positive   FUNCTIONAL TESTS:  30 Second Sit to Stand: 10 reps - 04/09/2023   GAIT: Distance walked: 23ft Assistive device utilized: None Level of assistance: Complete Independence Comments: antalgic gait R, decreased gait speed   TREATMENT: [facility] Adult PT Treatment:                                                DATE: 04/09/23 Therapeutic Exercise:  Nustep L5 x 4 min while taking subjective Supine PPT x 10 - 5" hold 90/90 hold 2x30" Bridge 2x10 (small range) LTR x 10 - 5" hold Supine SLR with pilates ring x 10 STS with OH press 2x10 5# Standing hip abd/ext x 10 12.5# Pallof press 2x10 GTB  [facility] Adult PT Treatment:                                                DATE: 04/07/23 Therapeutic Exercise:  Nustep L5 x 4 min while taking subjective Supine PPT x 10 - 5" hold 90/90 hold 2x30" Bridge 2x10 (small range) LTR x 10 - 5" hold Supine SLR with pilates ring x 10 STS with OH press 2x10 5# Lateral walk RTB x 3 laps at counter Standing hip abd/ext x 10 RTB Pallof  press 2x10 GTB  [facility] Adult PT Treatment:                                                DATE: 04/01/23 Therapeutic Exercise: Nustep L4 x 8 min while taking subjective QL stretch 30s x2 Bil Open book 10/10 Modified thomas stretch x 60s Bil with bolster Supine PPT x 10 - 5" hold Supine PPT with alternating march, 15x 90/90 30s x2 Supine hip fallouts 15x Bil, 15/15 unilaterally GTB  PATIENT EDUCATION:  Education details: continue HEP Person educated: Patient Education method: Explanation, Demonstration, and Handouts Education comprehension: verbalized understanding and returned demonstration   HOME EXERCISE PROGRAM: Access Code: [email or web address] URL: [email or web address] Date: 04/01/2023 Prepared by:  Gustavus Bryant  Exercises - Seated Sciatic Tensioner  - 1-2 x daily - 7 x weekly - 2 sets - 10 reps - Modified Thomas Stretch  - 1-2 x daily - 7 x weekly - 2 reps - 30 sec hold - Supine Posterior Pelvic Tilt  - 1-2 x daily - 7 x weekly - 2 sets - 10 reps - 5 sec hold - Supine Double Knee to Chest  - 1-2 x daily - 7 x weekly - 2-3 reps - 30 sec hold - Hooklying Single Leg Bent Knee Fallouts with Resistance  - 1 x daily - 5 x weekly - 2 sets - 10 reps - Supine Quadratus Lumborum Stretch  - 1 x daily - 5 x weekly - 1 sets - 30s hold - Curl Up with Reach  - 1 x daily - 5 x weekly - 1 sets - 15 reps   ASSESSMENT:   CLINICAL IMPRESSION: Pt was able to complete all prescribed exercises with no adverse effect. Therapy focused once again on core and proximal hip strengthening. Pt demonstrated improved functional mobility with increased reps in 30 Second Sit to Stand. Should continue to keep progressing with PT with HEP updated. Will continue per POC.    OBJECTIVE IMPAIRMENTS: decreased activity tolerance, decreased mobility, difficulty walking, decreased ROM, decreased strength, postural dysfunction, and pain.    ACTIVITY LIMITATIONS: carrying, lifting, standing, squatting, and locomotion level   PARTICIPATION LIMITATIONS: meal prep, cleaning, driving, shopping, community activity, and yard work   PERSONAL FACTORS: Time since onset of injury/illness/exacerbation and 1-2 comorbidities: DM II  are also affecting patient's functional outcome.     GOALS: Goals reviewed with patient? No   SHORT TERM GOALS: Target date: 04/01/2023   Pt will be compliant and knowledgeable with initial HEP for improved comfort and carryover Baseline: initial HEP given  Goal status: MET   2.  Pt will self report back and R LE pain no greater than 6/10 for improved comfort and functional ability Baseline: 10/10 at worst Goal status: IMET   LONG TERM GOALS: Target date: 05/06/2023   Pt will improve FOTO function  score to no less than 65% as proxy for functional improvement Baseline: 48% function 04/07/2023: 70% function Goal status: MET   2.  Pt will self report back and R LE pain no greater than 3/10 for improved comfort and functional ability Baseline: 10/10 at worst Goal status: INITIAL    3.  Pt will increase 30 Second Sit to Stand rep count to no less than 10 reps for improved balance, strength, and functional mobility Baseline: 8 reps (MCID 2 reps) 04/09/2023: 10 reps Goal status:  MET    4.  Pt will be able to navigate 15 stairs with reciprocal gait and no increase in pain for improved home navigation and functional mobility Baseline: unable Goal status: INITIAL   PLAN:   PT FREQUENCY: 2x/week   PT DURATION: 8 weeks   PLANNED INTERVENTIONS: Therapeutic exercises, Therapeutic activity, Neuromuscular re-education, Balance training, Gait training, Patient/Family education, Self Care, Joint mobilization, Dry Needling, Electrical stimulation, Cryotherapy, Moist heat, Vasopneumatic device, Manual therapy, and Re-evaluation.   PLAN FOR NEXT SESSION: assess HEP response, progress core strength, hip flexor stretching   Eloy End, PT 04/09/2023, 1:43 PM

## 2023-04-22 ENCOUNTER — Ambulatory Visit: Payer: Medicare Other | Attending: Family Medicine

## 2023-04-22 DIAGNOSIS — M5459 Other low back pain: Secondary | ICD-10-CM

## 2023-04-22 DIAGNOSIS — R2689 Other abnormalities of gait and mobility: Secondary | ICD-10-CM

## 2023-04-22 DIAGNOSIS — M6281 Muscle weakness (generalized): Secondary | ICD-10-CM | POA: Diagnosis present

## 2023-04-22 NOTE — Therapy (Signed)
OUTPATIENT PHYSICAL THERAPY TREATMENT NOTE   Patient Name: Emma Hampton MRN: [id number] DOB:1956/04/06, 67 y.o., female Today's Date: 04/22/2023  PCP: Karie Georges, MD  REFERRING PROVIDER: Karie Georges, MD   END OF SESSION:   PT End of Session - 04/22/23 1212     Visit Number 8    Number of Visits 17    Date for PT Re-Evaluation 05/06/23    Authorization Type Medicare    Progress Note Due on Visit 10    PT Start Time 1215    PT Stop Time 1253    PT Time Calculation (min) 38 min    Activity Tolerance Patient tolerated treatment well    Behavior During Therapy WFL for tasks assessed/performed             Past Medical History:  Diagnosis Date   Diabetes mellitus without complication (HCC)    Fibroids    Retroperitoneal abscess (HCC)    Past Surgical History:  Procedure Laterality Date   BREAST BIOPSY Right    CESAREAN SECTION     CYST EXCISION     TUBAL LIGATION     Patient Active Problem List   Diagnosis Date Noted   Prediabetes 08/01/2022   Allergy to bee sting 07/31/2022   Postural dizziness with presyncope 07/31/2022   Meningitis, viral 09/10/2013    REFERRING DIAG: M48.26 (ICD-10-CM) - Baastrup disease of lumbar spine M54.50 (ICD-10-CM) - Acute left-sided low back pain without sciatica M79.604 (ICD-10-CM) - Acute leg pain, right  THERAPY DIAG:  Other low back pain - Plan: PT plan of care cert/re-cert   Muscle weakness (generalized) - Plan: PT plan of care cert/re-cert   Other abnormalities of gait and mobility - Plan: PT plan of care cert/re-cert  Rationale for Evaluation and Treatment Rehabilitation  PERTINENT HISTORY: DM II   PRECAUTIONS: scoliosis and "kissing" spine   SUBJECTIVE:                                                                                                                                                                                      SUBJECTIVE STATEMENT:  Patient reports continued lower back  pain, states she has been doing more yard work recently.    PAIN:  Are you having pain?  Yes: NPRS scale: 6/10 Pain location: low back Pain description: ache Aggravating factors: undetermined Relieving factors: pain patches   OBJECTIVE: (objective measures completed at initial evaluation unless otherwise dated)   DIAGNOSTIC FINDINGS:  CLINICAL DATA:  Back pain.   EXAM: LUMBAR SPINE - 2-3 VIEW   COMPARISON:  Prior study from 2015   FINDINGS: Significant left convex lumbar scoliosis and associated  degenerative lumbar spondylosis with lower lumbar disc disease and facet disease. The spinous processes are also enlarged and appear to be touching suggesting Basstrup's disease. No acute bony findings or destructive bony changes. Vascular calcifications are noted. Radiodensity in the left upper quadrant likely a pill in the stomach. The bony pelvis is intact. Mild hip and SI joint degenerative changes.   IMPRESSION: 1. Scoliosis and degenerative lumbar spondylosis with lower lumbar disc disease and facet disease. 2. Suspect Basstrup's disease. 3. No acute bony findings.   PATIENT SURVEYS:  FOTO: 48% function; 65% predicted  04/07/2023: 70% function   COGNITION: Overall cognitive status: Within functional limits for tasks assessed                          SENSATION: [facility]   MUSCLE LENGTH: Thomas test: Right (+); Left (+)   POSTURE: increased lumbar lordosis   PALPATION: Increased tone and TTP to bilateral lumbar paraspinals   LUMBAR ROM:    AROM eval  Flexion Decreased; slight pain  Extension Decreased; very painful  Right lateral flexion    Left lateral flexion    Right rotation    Left rotation     (Blank rows = not tested)   LOWER EXTREMITY MMT:     MMT Right eval Left eval  Hip flexion 4/5 4/5  Hip extension      Hip abduction 3+/5 3+/5  Hip adduction      Hip internal rotation      Hip external rotation      Knee flexion      Knee extension       Ankle dorsiflexion      Ankle plantarflexion      Ankle inversion      Ankle eversion       (Blank rows = not tested)   LUMBAR SPECIAL TESTS:  Straight leg raise test: Positive and Slump test: Positive   FUNCTIONAL TESTS:  30 Second Sit to Stand: 10 reps - 04/09/2023   GAIT: Distance walked: 87ft Assistive device utilized: None Level of assistance: Complete Independence Comments: antalgic gait R, decreased gait speed   TREATMENT: [facility] Adult PT Treatment:                                                DATE: 04/22/23 Therapeutic Exercise:  Nustep L5 x 5 min while taking subjective Standing hip abd/ext x10 BIL 12.5# (pain on Lt with ext) Pallof press 2x10 BIL 7# Supine PPT x 10 - 5" hold 90/90 hold 2x30" Bridge 2x10 (small range, cramp in in Rt hamstring) Supine DKTC heels on green pball 2x10 LTR x 10 - 5" hold Supine SLR x 10 STS with OH press 2x10 5#   [facility] Adult PT Treatment:                                                DATE: 04/09/23 Therapeutic Exercise:  Nustep L5 x 4 min while taking subjective Supine PPT x 10 - 5" hold 90/90 hold 2x30" Bridge 2x10 (small range) LTR x 10 - 5" hold Supine SLR with pilates ring x 10 STS with OH press 2x10 5# Standing hip abd/ext x 10 12.5#  Pallof press 2x10 GTB  [facility] Adult PT Treatment:                                                DATE: 04/07/23 Therapeutic Exercise:  Nustep L5 x 4 min while taking subjective Supine PPT x 10 - 5" hold 90/90 hold 2x30" Bridge 2x10 (small range) LTR x 10 - 5" hold Supine SLR with pilates ring x 10 STS with OH press 2x10 5# Lateral walk RTB x 3 laps at counter Standing hip abd/ext x 10 RTB Pallof press 2x10 GTB   PATIENT EDUCATION:  Education details: continue HEP Person educated: Patient Education method: Programmer, multimedia, Demonstration, and Handouts Education comprehension: verbalized understanding and returned demonstration   HOME EXERCISE PROGRAM: Access Code: [email or web address] URL:  [email or web address] Date: 04/01/2023 Prepared by: Gustavus Bryant  Exercises - Seated Sciatic Tensioner  - 1-2 x daily - 7 x weekly - 2 sets - 10 reps - Modified Thomas Stretch  - 1-2 x daily - 7 x weekly - 2 reps - 30 sec hold - Supine Posterior Pelvic Tilt  - 1-2 x daily - 7 x weekly - 2 sets - 10 reps - 5 sec hold - Supine Double Knee to Chest  - 1-2 x daily - 7 x weekly - 2-3 reps - 30 sec hold - Hooklying Single Leg Bent Knee Fallouts with Resistance  - 1 x daily - 5 x weekly - 2 sets - 10 reps - Supine Quadratus Lumborum Stretch  - 1 x daily - 5 x weekly - 1 sets - 30s hold - Curl Up with Reach  - 1 x daily - 5 x weekly - 1 sets - 15 reps   ASSESSMENT:   CLINICAL IMPRESSION:  Patient presents to PT reporting continued lower back pain and HEP compliance over the past two weeks. Session today continued to focus on proximal hip and core strengthening. She had a muscle cramp in Rt hamstring with bridges, alleviated with smaller ROM. Patient continues to benefit from skilled PT services and should be progressed as able to improve functional independence.     OBJECTIVE IMPAIRMENTS: decreased activity tolerance, decreased mobility, difficulty walking, decreased ROM, decreased strength, postural dysfunction, and pain.    ACTIVITY LIMITATIONS: carrying, lifting, standing, squatting, and locomotion level   PARTICIPATION LIMITATIONS: meal prep, cleaning, driving, shopping, community activity, and yard work   PERSONAL FACTORS: Time since onset of injury/illness/exacerbation and 1-2 comorbidities: DM II  are also affecting patient's functional outcome.     GOALS: Goals reviewed with patient? No   SHORT TERM GOALS: Target date: 04/01/2023   Pt will be compliant and knowledgeable with initial HEP for improved comfort and carryover Baseline: initial HEP given  Goal status: MET   2.  Pt will self report back and R LE pain no greater than 6/10 for improved comfort and  functional ability Baseline: 10/10 at worst Goal status: IMET   LONG TERM GOALS: Target date: 05/06/2023   Pt will improve FOTO function score to no less than 65% as proxy for functional improvement Baseline: 48% function 04/07/2023: 70% function Goal status: MET   2.  Pt will self report back and R LE pain no greater than 3/10 for improved comfort and functional ability Baseline: 10/10 at worst Goal status: INITIAL    3.  Pt will increase 30 Second Sit to Stand  rep count to no less than 10 reps for improved balance, strength, and functional mobility Baseline: 8 reps (MCID 2 reps) 04/09/2023: 10 reps Goal status: MET    4.  Pt will be able to navigate 15 stairs with reciprocal gait and no increase in pain for improved home navigation and functional mobility Baseline: unable Goal status: INITIAL   PLAN:   PT FREQUENCY: 2x/week   PT DURATION: 8 weeks   PLANNED INTERVENTIONS: Therapeutic exercises, Therapeutic activity, Neuromuscular re-education, Balance training, Gait training, Patient/Family education, Self Care, Joint mobilization, Dry Needling, Electrical stimulation, Cryotherapy, Moist heat, Vasopneumatic device, Manual therapy, and Re-evaluation.   PLAN FOR NEXT SESSION: assess HEP response, progress core strength, hip flexor stretching   Berta Minor, PTA 04/22/2023, 12:54 PM

## 2023-04-29 ENCOUNTER — Ambulatory Visit: Payer: Medicare Other

## 2023-04-29 DIAGNOSIS — R2689 Other abnormalities of gait and mobility: Secondary | ICD-10-CM

## 2023-04-29 DIAGNOSIS — M6281 Muscle weakness (generalized): Secondary | ICD-10-CM

## 2023-04-29 DIAGNOSIS — M5459 Other low back pain: Secondary | ICD-10-CM | POA: Diagnosis not present

## 2023-04-29 NOTE — Therapy (Signed)
OUTPATIENT PHYSICAL THERAPY TREATMENT NOTE   Patient Name: Emma Hampton MRN: [id number] DOB:11-17-1956, 67 y.o., female Today's Date: 04/29/2023  PCP: Karie Georges, MD  REFERRING PROVIDER: Karie Georges, MD   END OF SESSION:   PT End of Session - 04/29/23 1217     Visit Number 9    Number of Visits 17    Date for PT Re-Evaluation 05/06/23    Authorization Type Medicare    Progress Note Due on Visit 10    PT Start Time 1217    PT Stop Time 1255    PT Time Calculation (min) 38 min    Activity Tolerance Patient tolerated treatment well    Behavior During Therapy WFL for tasks assessed/performed              Past Medical History:  Diagnosis Date   Diabetes mellitus without complication (HCC)    Fibroids    Retroperitoneal abscess (HCC)    Past Surgical History:  Procedure Laterality Date   BREAST BIOPSY Right    CESAREAN SECTION     CYST EXCISION     TUBAL LIGATION     Patient Active Problem List   Diagnosis Date Noted   Prediabetes 08/01/2022   Allergy to bee sting 07/31/2022   Postural dizziness with presyncope 07/31/2022   Meningitis, viral 09/10/2013    REFERRING DIAG: M48.26 (ICD-10-CM) - Baastrup disease of lumbar spine M54.50 (ICD-10-CM) - Acute left-sided low back pain without sciatica M79.604 (ICD-10-CM) - Acute leg pain, right  THERAPY DIAG:  Other low back pain - Plan: PT plan of care cert/re-cert   Muscle weakness (generalized) - Plan: PT plan of care cert/re-cert   Other abnormalities of gait and mobility - Plan: PT plan of care cert/re-cert  Rationale for Evaluation and Treatment Rehabilitation  PERTINENT HISTORY: DM II   PRECAUTIONS: scoliosis and "kissing" spine   SUBJECTIVE:                                                                                                                                                                                      SUBJECTIVE STATEMENT:  Patient reports that her back pain is  lessened today, stated she is sore on the L side of her abdomen from laughing too hard.    PAIN:  Are you having pain?  Yes: NPRS scale: 5/10 Pain location: low back Pain description: ache Aggravating factors: undetermined Relieving factors: pain patches   OBJECTIVE: (objective measures completed at initial evaluation unless otherwise dated)   DIAGNOSTIC FINDINGS:  CLINICAL DATA:  Back pain.   EXAM: LUMBAR SPINE - 2-3 VIEW   COMPARISON:  Prior study from 2015  FINDINGS: Significant left convex lumbar scoliosis and associated degenerative lumbar spondylosis with lower lumbar disc disease and facet disease. The spinous processes are also enlarged and appear to be touching suggesting Basstrup's disease. No acute bony findings or destructive bony changes. Vascular calcifications are noted. Radiodensity in the left upper quadrant likely a pill in the stomach. The bony pelvis is intact. Mild hip and SI joint degenerative changes.   IMPRESSION: 1. Scoliosis and degenerative lumbar spondylosis with lower lumbar disc disease and facet disease. 2. Suspect Basstrup's disease. 3. No acute bony findings.   PATIENT SURVEYS:  FOTO: 48% function; 65% predicted  04/07/2023: 70% function   COGNITION: Overall cognitive status: Within functional limits for tasks assessed                          SENSATION: [facility]   MUSCLE LENGTH: Thomas test: Right (+); Left (+)   POSTURE: increased lumbar lordosis   PALPATION: Increased tone and TTP to bilateral lumbar paraspinals   LUMBAR ROM:    AROM eval  Flexion Decreased; slight pain  Extension Decreased; very painful  Right lateral flexion    Left lateral flexion    Right rotation    Left rotation     (Blank rows = not tested)   LOWER EXTREMITY MMT:     MMT Right eval Left eval  Hip flexion 4/5 4/5  Hip extension      Hip abduction 3+/5 3+/5  Hip adduction      Hip internal rotation      Hip external rotation      Knee  flexion      Knee extension      Ankle dorsiflexion      Ankle plantarflexion      Ankle inversion      Ankle eversion       (Blank rows = not tested)   LUMBAR SPECIAL TESTS:  Straight leg raise test: Positive and Slump test: Positive   FUNCTIONAL TESTS:  30 Second Sit to Stand: 10 reps - 04/09/2023   GAIT: Distance walked: 76ft Assistive device utilized: None Level of assistance: Complete Independence Comments: antalgic gait R, decreased gait speed   TREATMENT: [facility] Adult PT Treatment:                                                DATE: 04/29/23 Therapeutic Exercise:  Nustep L5 x 5 min while taking subjective Standing hip abd/ext x10 BIL 12.5# (less pain today on Lt) Pallof press 2x10 BIL 7# Supine PPT x 10 - 5" hold 90/90 hold 2x30" 90/90 with alternating heel taps 2x30" DTKC 2x20" BIL Sidelying hip abduction 2x10 BIL STS with OH press 2x10 6#   [facility] Adult PT Treatment:                                                DATE: 04/22/23 Therapeutic Exercise:  Nustep L5 x 5 min while taking subjective Standing hip abd/ext x10 BIL 12.5# (pain on Lt with ext) Pallof press 2x10 BIL 7# Supine PPT x 10 - 5" hold 90/90 hold 2x30" Bridge 2x10 (small range, cramp in in Rt hamstring) Supine DKTC heels on green pball 2x10 LTR  x 10 - 5" hold Supine SLR x 10 STS with OH press 2x10 5#   [facility] Adult PT Treatment:                                                DATE: 04/09/23 Therapeutic Exercise:  Nustep L5 x 4 min while taking subjective Supine PPT x 10 - 5" hold 90/90 hold 2x30" Bridge 2x10 (small range) LTR x 10 - 5" hold Supine SLR with pilates ring x 10 STS with OH press 2x10 5# Standing hip abd/ext x 10 12.5# Pallof press 2x10 GTB   PATIENT EDUCATION:  Education details: continue HEP Person educated: Patient Education method: Explanation, Demonstration, and Handouts Education comprehension: verbalized understanding and returned demonstration   HOME EXERCISE  PROGRAM: Access Code: [email or web address] URL: [email or web address] Date: 04/01/2023 Prepared by: Gustavus Bryant  Exercises - Seated Sciatic Tensioner  - 1-2 x daily - 7 x weekly - 2 sets - 10 reps - Modified Thomas Stretch  - 1-2 x daily - 7 x weekly - 2 reps - 30 sec hold - Supine Posterior Pelvic Tilt  - 1-2 x daily - 7 x weekly - 2 sets - 10 reps - 5 sec hold - Supine Double Knee to Chest  - 1-2 x daily - 7 x weekly - 2-3 reps - 30 sec hold - Hooklying Single Leg Bent Knee Fallouts with Resistance  - 1 x daily - 5 x weekly - 2 sets - 10 reps - Supine Quadratus Lumborum Stretch  - 1 x daily - 5 x weekly - 1 sets - 30s hold - Curl Up with Reach  - 1 x daily - 5 x weekly - 1 sets - 15 reps   ASSESSMENT:   CLINICAL IMPRESSION:  Patient presents to PT reporting continued lower back pain, though lessened today. Session today focused on proximal hip and core strengthening with increased difficulty today to good effect. Patient was able to tolerate all prescribed exercises with no adverse effects. Patient continues to benefit from skilled PT services and should be progressed as able to improve functional independence.    OBJECTIVE IMPAIRMENTS: decreased activity tolerance, decreased mobility, difficulty walking, decreased ROM, decreased strength, postural dysfunction, and pain.    ACTIVITY LIMITATIONS: carrying, lifting, standing, squatting, and locomotion level   PARTICIPATION LIMITATIONS: meal prep, cleaning, driving, shopping, community activity, and yard work   PERSONAL FACTORS: Time since onset of injury/illness/exacerbation and 1-2 comorbidities: DM II  are also affecting patient's functional outcome.     GOALS: Goals reviewed with patient? No   SHORT TERM GOALS: Target date: 04/01/2023   Pt will be compliant and knowledgeable with initial HEP for improved comfort and carryover Baseline: initial HEP given  Goal status: MET   2.  Pt will self report back and R LE pain no  greater than 6/10 for improved comfort and functional ability Baseline: 10/10 at worst Goal status: IMET   LONG TERM GOALS: Target date: 05/06/2023   Pt will improve FOTO function score to no less than 65% as proxy for functional improvement Baseline: 48% function 04/07/2023: 70% function Goal status: MET   2.  Pt will self report back and R LE pain no greater than 3/10 for improved comfort and functional ability Baseline: 10/10 at worst Goal status: INITIAL    3.  Pt will increase 30 Second Sit to  Stand rep count to no less than 10 reps for improved balance, strength, and functional mobility Baseline: 8 reps (MCID 2 reps) 04/09/2023: 10 reps Goal status: MET    4.  Pt will be able to navigate 15 stairs with reciprocal gait and no increase in pain for improved home navigation and functional mobility Baseline: unable Goal status: INITIAL   PLAN:   PT FREQUENCY: 2x/week   PT DURATION: 8 weeks   PLANNED INTERVENTIONS: Therapeutic exercises, Therapeutic activity, Neuromuscular re-education, Balance training, Gait training, Patient/Family education, Self Care, Joint mobilization, Dry Needling, Electrical stimulation, Cryotherapy, Moist heat, Vasopneumatic device, Manual therapy, and Re-evaluation.   PLAN FOR NEXT SESSION: assess HEP response, progress core strength, hip flexor stretching   Berta Minor, PTA 04/29/2023, 12:55 PM

## 2023-05-05 ENCOUNTER — Ambulatory Visit (INDEPENDENT_AMBULATORY_CARE_PROVIDER_SITE_OTHER): Payer: Medicare Other | Admitting: Family Medicine

## 2023-05-05 ENCOUNTER — Encounter: Payer: Self-pay | Admitting: Family Medicine

## 2023-05-05 VITALS — BP 102/82 | HR 70 | Temp 97.8°F | Ht 64.0 in | Wt 133.8 lb

## 2023-05-05 DIAGNOSIS — E782 Mixed hyperlipidemia: Secondary | ICD-10-CM

## 2023-05-05 DIAGNOSIS — Z23 Encounter for immunization: Secondary | ICD-10-CM

## 2023-05-05 DIAGNOSIS — M545 Low back pain, unspecified: Secondary | ICD-10-CM | POA: Diagnosis not present

## 2023-05-05 DIAGNOSIS — R7303 Prediabetes: Secondary | ICD-10-CM

## 2023-05-05 DIAGNOSIS — E876 Hypokalemia: Secondary | ICD-10-CM | POA: Diagnosis not present

## 2023-05-05 LAB — POCT GLYCOSYLATED HEMOGLOBIN (HGB A1C): Hemoglobin A1C: 5.7 % — AB (ref 4.0–5.6)

## 2023-05-05 MED ORDER — POTASSIUM CHLORIDE ER 20 MEQ PO TBCR: 1.0000 | EXTENDED_RELEASE_TABLET | Freq: Every day | ORAL | 1 refills | Status: DC

## 2023-05-05 MED ORDER — ATORVASTATIN CALCIUM 20 MG PO TABS: 20.0000 mg | ORAL_TABLET | Freq: Every day | ORAL | 1 refills | Status: DC

## 2023-05-05 NOTE — Assessment & Plan Note (Signed)
A1C today is better than previous, will continue to monitor this every 6 months. Reduced carb diet was reinforced with patient today

## 2023-05-05 NOTE — Progress Notes (Signed)
Established Patient Office Visit  Subjective   Patient ID: Emma Hampton, female    DOB: [birth year]  Age: 67 y.o. MRN: [id number]  Chief Complaint  Patient presents with   Medical Management of Chronic Issues    Pt is here for follow up on her back pain, states that she saw Dr. Franky Macho and he told her she did not have Basstrup's. She reports that the left leg is improving, has been going to physical therapy and reports this has improved her symptoms. States she was told she had brittle bones on her x-rays, it was recommended that she get a repeat DEXA scan. This has been ordered but not scheduled.   HLD-- pt needs refills today on her atorvastatin, she reports no muscle cramps.  Predm-- A1C is 5.7 today which is better than previous. We will continue to monitor this number every 6 months.     Current Outpatient Medications  Medication Instructions   ascorbic acid (VITAMIN C) 500 mg, Oral, Daily   atorvastatin (LIPITOR) 20 mg, Oral, Daily at bedtime   EPINEPHrine (EPI-PEN) 0.3 mg, Intramuscular, As needed   Multiple Vitamin (MULTIVITAMIN) tablet 1 tablet, Daily   Potassium Chloride ER 20 MEQ TBCR 20 mEq, Oral, Daily    Patient Active Problem List   Diagnosis Date Noted   Prediabetes 08/01/2022   Allergy to bee sting 07/31/2022   Postural dizziness with presyncope 07/31/2022   Meningitis, viral 09/10/2013      Review of Systems  All other systems reviewed and are negative.     Objective:     BP 102/82 (BP Location: Left Arm, Patient Position: Sitting, Cuff Size: Normal)   Pulse 70   Temp 97.8 F (36.6 C) (Oral)   Ht 5\' 4"  (1.626 m)   Wt 133 lb 12.8 oz (60.7 kg)   SpO2 98%   BMI 22.97 kg/m    Physical Exam Vitals reviewed.  Constitutional:      Appearance: Normal appearance. She is well-groomed and normal weight.  Cardiovascular:     Rate and Rhythm: Normal rate and regular rhythm.     Pulses: Normal pulses.     Heart sounds: S1 normal and S2  normal.  Pulmonary:     Effort: Pulmonary effort is normal.     Breath sounds: Normal breath sounds and air entry.  Musculoskeletal:     Right lower leg: No edema.     Left lower leg: No edema.  Neurological:     Mental Status: She is alert and oriented to person, place, and time. Mental status is at baseline.     Gait: Gait is intact.  Psychiatric:        Mood and Affect: Mood and affect normal.        Speech: Speech normal.        Behavior: Behavior normal.      Results for orders placed or performed in visit on 05/05/23  POC HgB A1c  Result Value Ref Range   Hemoglobin A1C 5.7 (A) 4.0 - 5.6 %   HbA1c POC (<> result, manual entry)     HbA1c, POC (prediabetic range)     HbA1c, POC (controlled diabetic range)        The ASCVD Risk score (Arnett DK, et al., 2019) failed to calculate for the following reasons:   Cannot find a previous HDL lab   Cannot find a previous total cholesterol lab    Assessment & Plan:  Prediabetes Assessment & Plan: A1C today  is better than previous, will continue to monitor this every 6 months. Reduced carb diet was reinforced with patient today  Orders: -     POCT glycosylated hemoglobin (Hb A1C)  Mixed hyperlipidemia -     Atorvastatin Calcium; Take 1 tablet (20 mg total) by mouth at bedtime.  Dispense: 90 tablet; Refill: 1  Hypokalemia -     Potassium Chloride ER; Take 1 tablet (20 mEq total) by mouth daily.  Dispense: 90 tablet; Refill: 1  Immunization due -     Pneumococcal conjugate vaccine 20-valent  Acute left-sided low back pain without sciatica   Back pain and the leg weakness is improved overall, I will follow up on the DEXA scan that was ordered in March and I attempted to review the note from the neurosurgeon but it was not available in care everywhere. She will continue physical therapy exercises. If her leg weakness returns then she will need an MRI of the lumbar spine.   Return in about 6 months (around 11/05/2023).     Karie Georges, MD

## 2023-05-06 ENCOUNTER — Ambulatory Visit: Payer: Medicare Other

## 2023-05-06 DIAGNOSIS — M5459 Other low back pain: Secondary | ICD-10-CM | POA: Diagnosis not present

## 2023-05-06 DIAGNOSIS — M6281 Muscle weakness (generalized): Secondary | ICD-10-CM

## 2023-05-06 NOTE — Therapy (Signed)
OUTPATIENT PHYSICAL THERAPY TREATMENT NOTE/DISCHARGE  PHYSICAL THERAPY DISCHARGE SUMMARY  Visits from Start of Care: 10  Current functional level related to goals / functional outcomes: See goals and objective   Remaining deficits: See goals and objective   Education / Equipment: HEP   Patient agrees to discharge. Patient goals were met. Patient is being discharged due to meeting the stated rehab goals.   Patient Name: Emma Hampton MRN: [id number] DOB:01-21-1956, 67 y.o., female Today's Date: 05/06/2023  PCP: Karie Georges, MD  REFERRING PROVIDER: Karie Georges, MD   END OF SESSION:   PT End of Session - 05/06/23 1221     Visit Number 10    Number of Visits 17    Date for PT Re-Evaluation 05/06/23    Authorization Type Medicare    Progress Note Due on Visit 10    PT Start Time 1219   arrived   PT Stop Time 1245    PT Time Calculation (min) 26 min    Activity Tolerance Patient tolerated treatment well    Behavior During Therapy [facility] for tasks assessed/performed               Past Medical History:  Diagnosis Date   Diabetes mellitus without complication (HCC)    Fibroids    Retroperitoneal abscess (HCC)    Past Surgical History:  Procedure Laterality Date   BREAST BIOPSY Right    CESAREAN SECTION     CYST EXCISION     TUBAL LIGATION     Patient Active Problem List   Diagnosis Date Noted   Prediabetes 08/01/2022   Allergy to bee sting 07/31/2022   Postural dizziness with presyncope 07/31/2022   Meningitis, viral 09/10/2013    REFERRING DIAG: M48.26 (ICD-10-CM) - [place] disease of lumbar spine M54.50 (ICD-10-CM) - Acute left-sided low back pain without sciatica M79.604 (ICD-10-CM) - Acute leg pain, right  THERAPY DIAG:  Other low back pain - Plan: PT plan of care cert/re-cert   Muscle weakness (generalized) - Plan: PT plan of care cert/re-cert   Other abnormalities of gait and mobility - Plan: PT plan of care  cert/re-cert  Rationale for Evaluation and Treatment Rehabilitation  PERTINENT HISTORY: DM II   PRECAUTIONS: scoliosis and "kissing" spine   SUBJECTIVE:                                                                                                                                                                                      SUBJECTIVE STATEMENT: Pt presents to PT with reports of slight lower back pain but otherwise is doing well. Feels like she is ready to discharge from PT. Has been  compliant with HEP with no adverse effect.    PAIN:  Are you having pain?  Yes: NPRS scale: 2/10 Pain location: low back Pain description: ache Aggravating factors: undetermined Relieving factors: pain patches   OBJECTIVE: (objective measures completed at initial evaluation unless otherwise dated)   DIAGNOSTIC FINDINGS:  CLINICAL DATA:  Back pain.   EXAM: LUMBAR SPINE - 2-3 VIEW   COMPARISON:  Prior study from 2015   FINDINGS: Significant left convex lumbar scoliosis and associated degenerative lumbar spondylosis with lower lumbar disc disease and facet disease. The spinous processes are also enlarged and appear to be touching suggesting Basstrup's disease. No acute bony findings or destructive bony changes. Vascular calcifications are noted. Radiodensity in the left upper quadrant likely a pill in the stomach. The bony pelvis is intact. Mild hip and SI joint degenerative changes.   IMPRESSION: 1. Scoliosis and degenerative lumbar spondylosis with lower lumbar disc disease and facet disease. 2. Suspect Basstrup's disease. 3. No acute bony findings.   PATIENT SURVEYS:  FOTO: 48% function; 65% predicted  04/07/2023: 70% function   COGNITION: Overall cognitive status: Within functional limits for tasks assessed                          SENSATION: [facility]   MUSCLE LENGTH: Thomas test: Right (+); Left (+)   POSTURE: increased lumbar lordosis   PALPATION: Increased tone and  TTP to bilateral lumbar paraspinals   LUMBAR ROM:    AROM eval  Flexion Decreased; slight pain  Extension Decreased; very painful  Right lateral flexion    Left lateral flexion    Right rotation    Left rotation     (Blank rows = not tested)   LOWER EXTREMITY MMT:     MMT Right eval Left eval  Hip flexion 4/5 4/5  Hip extension      Hip abduction 3+/5 3+/5  Hip adduction      Hip internal rotation      Hip external rotation      Knee flexion      Knee extension      Ankle dorsiflexion      Ankle plantarflexion      Ankle inversion      Ankle eversion       (Blank rows = not tested)   LUMBAR SPECIAL TESTS:  Straight leg raise test: Positive and Slump test: Positive   FUNCTIONAL TESTS:  30 Second Sit to Stand: 10 reps - 04/09/2023   GAIT: Distance walked: 34ft Assistive device utilized: None Level of assistance: Complete Independence Comments: antalgic gait R, decreased gait speed   TREATMENT: [facility] Adult PT Treatment:                                                DATE: 05/06/23 Therapeutic Exercise: Seated sciatic nerve glide x 10 each Modified thomas stretch x 60" each Supine PPT x 10 - 5" hold Supine 90/90 hold x 30" Supine 90/90 alt taps x 10 each Supine alt hip fallout x 10 GTB Bridge with GTB x 15 Therapeutic Activity:  Assessment of tests/measures, goals, and outcomes for discharge   [facility] Adult PT Treatment:  DATE: 04/29/23 Therapeutic Exercise:  Nustep L5 x 5 min while taking subjective Standing hip abd/ext x10 BIL 12.5# (less pain today on Lt) Pallof press 2x10 BIL 7# Supine PPT x 10 - 5" hold 90/90 hold 2x30" 90/90 with alternating heel taps 2x30" DTKC 2x20" BIL Sidelying hip abduction 2x10 BIL STS with OH press 2x10 6#   [facility] Adult PT Treatment:                                                DATE: 04/22/23 Therapeutic Exercise:  Nustep L5 x 5 min while taking subjective Standing hip abd/ext x10  BIL 12.5# (pain on Lt with ext) Pallof press 2x10 BIL 7# Supine PPT x 10 - 5" hold 90/90 hold 2x30" Bridge 2x10 (small range, cramp in in Rt hamstring) Supine DKTC heels on green pball 2x10 LTR x 10 - 5" hold Supine SLR x 10 STS with OH press 2x10 5#  PATIENT EDUCATION:  Education details: continue HEP Person educated: Patient Education method: Explanation, Demonstration, and Handouts Education comprehension: verbalized understanding and returned demonstration   HOME EXERCISE PROGRAM: Access Code: [email or web address] URL: [email or web address] Date: 05/06/2023 Prepared by: Edwinna Areola  Exercises - Seated Sciatic Tensioner  - 4 x weekly - 2 sets - 10 reps - Modified Thomas Stretch  - 4 x weekly - 2 reps - 30 sec hold - Supine Posterior Pelvic Tilt  - 4 x weekly - 2 sets - 10 reps - 5 sec hold - Supine Double Knee to Chest  - 4 x weekly - 2-3 reps - 60 sec hold - Hooklying Single Leg Bent Knee Fallouts with Resistance  - 4 x weekly - 3 sets - 10 reps - Supine Bridge with Resistance Band  - 4 x weekly - 3 sets - 15 reps - green band hold - Supine 90/90 Abdominal Bracing  - 4 x weekly - 2 reps - 30 sec hold - Supine 90/90 Alternating Heel Touches with Posterior Pelvic Tilt  - 4 x weekly - 3 sets - 10 reps   ASSESSMENT:   CLINICAL IMPRESSION:  Pt was able to complete prescribed exercises and demonstrated knowledge of HEP with no adverse effect. Over the course of PT treatment she has progressed very well meeting all LTGs and noting decreased levels of back pain. Pt should continue to improve with HEP and is being discharged from skilled PT at this time.    OBJECTIVE IMPAIRMENTS: decreased activity tolerance, decreased mobility, difficulty walking, decreased ROM, decreased strength, postural dysfunction, and pain.    ACTIVITY LIMITATIONS: carrying, lifting, standing, squatting, and locomotion level   PARTICIPATION LIMITATIONS: meal prep, cleaning, driving, shopping, community  activity, and yard work   PERSONAL FACTORS: Time since onset of injury/illness/exacerbation and 1-2 comorbidities: DM II  are also affecting patient's functional outcome.     GOALS: Goals reviewed with patient? No   SHORT TERM GOALS: Target date: 04/01/2023   Pt will be compliant and knowledgeable with initial HEP for improved comfort and carryover Baseline: initial HEP given  Goal status: MET   2.  Pt will self report back and R LE pain no greater than 6/10 for improved comfort and functional ability Baseline: 10/10 at worst Goal status: IMET   LONG TERM GOALS: Target date: 05/06/2023   Pt will improve FOTO function score to no less than 65% as proxy for  functional improvement Baseline: 48% function 04/07/2023: 70% function Goal status: MET   2.  Pt will self report back and R LE pain no greater than 3/10 for improved comfort and functional ability Baseline: 10/10 at worst Goal status: MET   3.  Pt will increase 30 Second Sit to Stand rep count to no less than 10 reps for improved balance, strength, and functional mobility Baseline: 8 reps (MCID 2 reps) 04/09/2023: 10 reps Goal status: MET    4.  Pt will be able to navigate 15 stairs with reciprocal gait and no increase in pain for improved home navigation and functional mobility Baseline: unable Goal status: MET   PLAN:   PT FREQUENCY: 2x/week   PT DURATION: 8 weeks   PLANNED INTERVENTIONS: Therapeutic exercises, Therapeutic activity, Neuromuscular re-education, Balance training, Gait training, Patient/Family education, Self Care, Joint mobilization, Dry Needling, Electrical stimulation, Cryotherapy, Moist heat, Vasopneumatic device, Manual therapy, and Re-evaluation.   PLAN FOR NEXT SESSION: assess HEP response, progress core strength, hip flexor stretching   Eloy End, PT 05/06/2023, 1:22 PM

## 2023-05-13 ENCOUNTER — Ambulatory Visit: Payer: Medicare Other

## 2023-05-26 ENCOUNTER — Ambulatory Visit (INDEPENDENT_AMBULATORY_CARE_PROVIDER_SITE_OTHER)
Admission: RE | Admit: 2023-05-26 | Discharge: 2023-05-26 | Disposition: A | Discharge status: Home / Self Care | Payer: Medicare Other | Source: Ambulatory Visit | Attending: Family Medicine | Admitting: Family Medicine

## 2023-05-26 DIAGNOSIS — Z0000 Encounter for general adult medical examination without abnormal findings: Secondary | ICD-10-CM

## 2023-05-26 DIAGNOSIS — M8589 Other specified disorders of bone density and structure, multiple sites: Secondary | ICD-10-CM | POA: Diagnosis not present

## 2023-06-11 ENCOUNTER — Telehealth: Payer: Self-pay | Admitting: Family Medicine

## 2023-06-11 NOTE — Telephone Encounter (Signed)
Left a message for the patient to return my call.  

## 2023-06-11 NOTE — Telephone Encounter (Signed)
Pt is calling and would like to know if she had her hep A and hep B vaccine

## 2023-06-11 NOTE — Telephone Encounter (Signed)
Not unless she is traveling internationally

## 2023-06-11 NOTE — Telephone Encounter (Signed)
I spoke with the patient and informed her we do not have any vaccinations on file for Hep A or B. Patient would like to know if she can receive these vaccines here in the clinic or does she need to go to her pharmacy?

## 2023-06-11 NOTE — Telephone Encounter (Signed)
She has medicare-- are we able to give them to her in office?

## 2023-06-11 NOTE — Telephone Encounter (Signed)
Patient informed to receive vaccines at pharmacy. Patent inquired if PCP recommends she get the Dengue fever vaccine?

## 2023-06-11 NOTE — Telephone Encounter (Signed)
Patient informed of the message below and voiced understanding  

## 2023-07-27 ENCOUNTER — Ambulatory Visit (INDEPENDENT_AMBULATORY_CARE_PROVIDER_SITE_OTHER): Payer: Medicare Other | Admitting: Family Medicine

## 2023-07-27 ENCOUNTER — Encounter: Payer: Self-pay | Admitting: Family Medicine

## 2023-07-27 VITALS — BP 110/60 | HR 65 | Temp 98.6°F | Ht 64.0 in | Wt 130.9 lb

## 2023-07-27 DIAGNOSIS — S81852A Open bite, left lower leg, initial encounter: Secondary | ICD-10-CM | POA: Diagnosis not present

## 2023-07-27 DIAGNOSIS — W540XXA Bitten by dog, initial encounter: Secondary | ICD-10-CM

## 2023-07-27 NOTE — Progress Notes (Signed)
Established Patient Office Visit  Subjective   Patient ID: Emma Hampton, female    DOB: 1956/07/10  Age: 67 y.o. MRN: [id number]  Chief Complaint  Patient presents with   Animal Bite    Patient complains of animal bite, x2 days, Patient reportedly has been cleaning wound with Neosporin    HPI   Here for dog bite left lateral leg.  This occurred 2 days ago.  She was visiting some friends in [place].  There were about 8 dogs total.  The owners of this dog that bit her are veterinarians.  Dog has been fully vaccinated for rabies.  Dog has not been acting strangely or sickly in any way.  There was some agitation among the dogs that day with multiple dogs present.  She states this barely broke the skin in a couple places.  She has been using Neosporin.  No fever.  No drainage.  Minimal pain.  Tetanus booster received just couple months ago  Past Medical History:  Diagnosis Date   Diabetes mellitus without complication (HCC)    Fibroids    Retroperitoneal abscess (HCC)    Past Surgical History:  Procedure Laterality Date   BREAST BIOPSY Right    CESAREAN SECTION     CYST EXCISION     TUBAL LIGATION      reports that she quit smoking about 7 years ago. Her smoking use included cigarettes. She has never used smokeless tobacco. She reports current alcohol use of about 1.0 standard drink of alcohol per week. She reports that she does not use drugs. family history includes Breast cancer in her daughter; Cancer in her daughter; Dementia in her maternal grandfather; Diabetes in her maternal uncle and another family member; Heart attack in her maternal grandmother; Heart disease in her mother; Prostate cancer in her maternal uncle. Allergies  Allergen Reactions   Codeine Nausea And Vomiting   Bee Venom     Review of Systems  Constitutional:  Negative for chills and fever.      Objective:     BP 110/60 (BP Location: Left Arm, Patient Position: Sitting, Cuff  Size: Normal)   Pulse 65   Temp 98.6 F (37 C) (Oral)   Ht 5\' 4"  (1.626 m)   Wt 130 lb 14.4 oz (59.4 kg)   SpO2 99%   BMI 22.47 kg/m    Physical Exam Vitals reviewed.  Constitutional:      Appearance: Normal appearance.  Cardiovascular:     Rate and Rhythm: Normal rate and regular rhythm.  Skin:    Comments: Left leg laterally reveals proximately 3 to 4 cm area of mild bruising.  To the center she has couple small puncture wounds and just inferior to those has a very small area of superficial abrasion.  No cellulitis changes.  No warmth.  No drainage.  Neurological:     Mental Status: She is alert.      No results found for any visits on 07/27/23.    The ASCVD Risk score (Arnett DK, et al., 2019) failed to calculate for the following reasons:   Cannot find a previous HDL lab   Cannot find a previous total cholesterol lab    Assessment & Plan:   Problem List Items Addressed This Visit   None Visit Diagnoses     Dog bite of left lower leg, initial encounter    -  Primary     Patient sustained dog bite 2 days ago with very superficial  bite left lateral leg.  No signs of secondary infection.  Tetanus up-to-date.  Low risk situation for rabies.  Dog has been fully vaccinated and is under close surveillance per veterinarians--who are the owners of the dog  -No indication for oral antibiotics at this time -Keep clean with soap and water -Follow-up for any signs of infection such as redness, warmth, puslike drainage  No follow-ups on file.    Evelena Peat, MD

## 2023-07-27 NOTE — Patient Instructions (Signed)
Follow up for any fever, increased redness, or pus like drainage.

## 2023-09-02 ENCOUNTER — Ambulatory Visit (INDEPENDENT_AMBULATORY_CARE_PROVIDER_SITE_OTHER): Payer: Medicare Other | Admitting: Family Medicine

## 2023-09-02 ENCOUNTER — Encounter: Payer: Self-pay | Admitting: Family Medicine

## 2023-09-02 VITALS — BP 120/80 | HR 77 | Temp 98.7°F | Ht 64.0 in | Wt 132.8 lb

## 2023-09-02 DIAGNOSIS — L729 Follicular cyst of the skin and subcutaneous tissue, unspecified: Secondary | ICD-10-CM | POA: Diagnosis not present

## 2023-09-02 DIAGNOSIS — M7989 Other specified soft tissue disorders: Secondary | ICD-10-CM | POA: Diagnosis not present

## 2023-09-02 DIAGNOSIS — T63441A Toxic effect of venom of bees, accidental (unintentional), initial encounter: Secondary | ICD-10-CM

## 2023-09-02 DIAGNOSIS — T63441D Toxic effect of venom of bees, accidental (unintentional), subsequent encounter: Secondary | ICD-10-CM

## 2023-09-02 NOTE — Progress Notes (Signed)
Established Patient Office Visit   Subjective  Patient ID: Emma Hampton, female    DOB: 05/09/1956  Age: 68 y.o. MRN: [id number]  Chief Complaint  Patient presents with   Finger Injury    Left finger swelling from bee sting, black head on Vagina    Skin Problem    Patient is a 67 year old female followed with Dr. Casimiro Needle and seen for several ongoing concerns.  Patient notes left second digit edema s/p bee sting month ago.  At times finger feels tight and has a stinging sensation like she is being restarted.  Patient able to bend finger without difficulty and denies pain.  Patient with a "blackhead" in vaginal area.  Present x 6 months.  Patient states she was told to use a loofah and monitor area.  Patient states area is about the same, hard.  Nonpainful, without drainage or erythema.    Patient Active Problem List   Diagnosis Date Noted   Prediabetes 08/01/2022   Allergy to bee sting 07/31/2022   Postural dizziness with presyncope 07/31/2022   Meningitis, viral 09/10/2013   Past Medical History:  Diagnosis Date   Diabetes mellitus without complication (HCC)    Fibroids    Retroperitoneal abscess (HCC)    Past Surgical History:  Procedure Laterality Date   BREAST BIOPSY Right    CESAREAN SECTION     CYST EXCISION     TUBAL LIGATION     Social History   Tobacco Use   Smoking status: Former    Current packs/day: 0.00    Types: Cigarettes    Quit date: 11/03/2015    Years since quitting: 7.8   Smokeless tobacco: Never  Vaping Use   Vaping status: Former  Substance Use Topics   Alcohol use: Yes    Alcohol/week: 1.0 standard drink of alcohol    Types: 1 Glasses of wine per week   Drug use: No   Family History  Problem Relation Age of Onset   Heart disease Mother        MI   Cancer Daughter        BREAST   LEFT MASECT   Breast cancer Daughter    Diabetes Maternal Uncle    Prostate cancer Maternal Uncle    Heart attack Maternal Grandmother     Dementia Maternal Grandfather    Diabetes Other    Allergies  Allergen Reactions   Codeine Nausea And Vomiting   Bee Venom       ROS Negative unless stated above    Objective:     BP 120/80 (BP Location: Left Arm, Patient Position: Sitting, Cuff Size: Normal)   Pulse 77   Temp 98.7 F (37.1 C) (Oral)   Ht 5\' 4"  (1.626 m)   Wt 132 lb 12.8 oz (60.2 kg)   SpO2 96%   BMI 22.80 kg/m    Physical Exam Constitutional:      General: She is not in acute distress.    Appearance: Normal appearance.  HENT:     Head: Normocephalic and atraumatic.     Nose: Nose normal.     Mouth/Throat:     Mouth: Mucous membranes are moist.  Cardiovascular:     Rate and Rhythm: Normal rate and regular rhythm.  Pulmonary:     Effort: Pulmonary effort is normal.  Skin:    General: Skin is warm and dry.          Comments: A 1 cm firm raised area in R  lateral mons pubis superior to R labia majora.    Neurological:     Mental Status: She is alert and oriented to person, place, and time.      No results found for any visits on 09/02/23.    Assessment & Plan:  Swollen finger  Bee sting, accidental or unintentional, initial encounter  Cyst of skin  Patient with left second digit edema s/p bee sting a few months ago.  Advised finger of likely to remain edematous for several months.  May have stiffness in joint.  Continue supportive care including ice.  Advised firm lesion in the pubic area is likely an epidermoid cyst.  Discussed r/b/a of I&D versus continuing to monitor.  Patient will schedule I&D at her convenience.   F/u with pcp prn.  Return if symptoms worsen or fail to improve.   Deeann Saint, MD

## 2023-11-20 ENCOUNTER — Other Ambulatory Visit: Payer: Self-pay | Admitting: Family Medicine

## 2023-11-20 DIAGNOSIS — E782 Mixed hyperlipidemia: Secondary | ICD-10-CM

## 2023-12-31 ENCOUNTER — Ambulatory Visit: Payer: Medicare Other | Admitting: Family Medicine

## 2024-01-01 ENCOUNTER — Ambulatory Visit: Payer: Medicare Other | Admitting: Family Medicine

## 2024-01-07 ENCOUNTER — Ambulatory Visit (INDEPENDENT_AMBULATORY_CARE_PROVIDER_SITE_OTHER): Payer: Medicare Other | Admitting: Family Medicine

## 2024-01-07 ENCOUNTER — Encounter: Payer: Self-pay | Admitting: Family Medicine

## 2024-01-07 VITALS — BP 118/72 | HR 80 | Temp 98.0°F | Ht 64.0 in | Wt 127.6 lb

## 2024-01-07 DIAGNOSIS — L729 Follicular cyst of the skin and subcutaneous tissue, unspecified: Secondary | ICD-10-CM | POA: Diagnosis not present

## 2024-01-07 NOTE — Progress Notes (Signed)
Established Patient Office Visit   Subjective  Patient ID: Emma Hampton, female    DOB: [birth year]  Age: 68 y.o. MRN: [id number]  Chief Complaint  Patient presents with   Cyst    On the right side of the vagina,  I&D today     Patient is a 68 year old female followed by Dr. Casimiro Needle seen for cyst removal.  Patient states cyst on right side pubic area present times years.  Area is nonpainful, without drainage or edema, but pt would like it removed.  States was told it was a blackhead.    Patient Active Problem List   Diagnosis Date Noted   Prediabetes 08/01/2022   Allergy to bee sting 07/31/2022   Postural dizziness with presyncope 07/31/2022   Meningitis, viral 09/10/2013   Past Medical History:  Diagnosis Date   Diabetes mellitus without complication (HCC)    Fibroids    Retroperitoneal abscess (HCC)    Past Surgical History:  Procedure Laterality Date   BREAST BIOPSY Right    CESAREAN SECTION     CYST EXCISION     TUBAL LIGATION     Social History   Tobacco Use   Smoking status: Former    Current packs/day: 0.00    Types: Cigarettes    Quit date: 11/03/2015    Years since quitting: 8.2   Smokeless tobacco: Never  Vaping Use   Vaping status: Former  Substance Use Topics   Alcohol use: Yes    Alcohol/week: 1.0 standard drink of alcohol    Types: 1 Glasses of wine per week   Drug use: No   Family History  Problem Relation Age of Onset   Heart disease Mother        MI   Cancer Daughter        BREAST   LEFT MASECT   Breast cancer Daughter    Diabetes Maternal Uncle    Prostate cancer Maternal Uncle    Heart attack Maternal Grandmother    Dementia Maternal Grandfather    Diabetes Other    Allergies  Allergen Reactions   Codeine Nausea And Vomiting   Bee Venom       ROS Negative unless stated above    Objective:     BP 118/72 (BP Location: Left Arm, Patient Position: Sitting, Cuff Size: Normal)   Pulse 80   Temp 98 F (36.7 C)  (Oral)   Ht 5\' 4"  (1.626 m)   Wt 127 lb 9.6 oz (57.9 kg)   SpO2 94%   BMI 21.90 kg/m  BP Readings from Last 3 Encounters:  01/07/24 118/72  09/02/23 120/80  07/27/23 110/60   Wt Readings from Last 3 Encounters:  01/07/24 127 lb 9.6 oz (57.9 kg)  09/02/23 132 lb 12.8 oz (60.2 kg)  07/27/23 130 lb 14.4 oz (59.4 kg)      Physical Exam Constitutional:      Appearance: Normal appearance.  HENT:     Head: Normocephalic and atraumatic.     Mouth/Throat:     Mouth: Mucous membranes are moist.  Cardiovascular:     Rate and Rhythm: Normal rate.  Pulmonary:     Effort: Pulmonary effort is normal.  Skin:    General: Skin is warm and dry.  Neurological:     Mental Status: She is alert and oriented to person, place, and time.     Incision and Drainage Procedure Note  Pre-operative Diagnosis: cyst  Post-operative Diagnosis: same  Indications: pt request  Anesthesia:  1% lidocaine with epinephrine  Procedure Details  The procedure, risks and complications have been discussed in detail (including, but not limited to airway compromise, infection, bleeding) with the patient, and the patient has signed consent to the procedure.  The skin was sterilely prepped and draped over the affected area in the usual fashion. After adequate local anesthesia, I&D with a #11 blade was performed on the right, lower mons pubis.  Thick brownish discharge expressed and cyst sac removed in pieces. The patient was observed until stable.  Findings: Epidermoid cyst  EBL: Minimal cc's  Drains: None  Condition: Tolerated procedure well and Stable   Complications: none.  No results found for any visits on 01/07/24.    Assessment & Plan:  Cyst of skin  Consent obtained.  I&D of cyst of right lateral mons pubis.  Patient tolerated procedure well.  Given handout on aftercare.  Return if symptoms worsen or fail to improve.  Follow-up with PCP as needed.  Deeann Saint, MD

## 2024-02-18 ENCOUNTER — Other Ambulatory Visit: Payer: Self-pay | Admitting: Family Medicine

## 2024-02-18 DIAGNOSIS — E876 Hypokalemia: Secondary | ICD-10-CM

## 2024-02-18 DIAGNOSIS — E782 Mixed hyperlipidemia: Secondary | ICD-10-CM

## 2024-02-29 ENCOUNTER — Emergency Department (HOSPITAL_COMMUNITY)

## 2024-02-29 ENCOUNTER — Encounter (HOSPITAL_COMMUNITY): Payer: Self-pay

## 2024-02-29 ENCOUNTER — Other Ambulatory Visit: Payer: Self-pay

## 2024-02-29 ENCOUNTER — Emergency Department (HOSPITAL_COMMUNITY)
Admission: EM | Admit: 2024-02-29 | Discharge: 2024-02-29 | Disposition: A | Discharge status: Home / Self Care | Attending: Emergency Medicine | Admitting: Emergency Medicine

## 2024-02-29 DIAGNOSIS — R42 Dizziness and giddiness: Secondary | ICD-10-CM | POA: Insufficient documentation

## 2024-02-29 DIAGNOSIS — R197 Diarrhea, unspecified: Secondary | ICD-10-CM | POA: Insufficient documentation

## 2024-02-29 DIAGNOSIS — R1110 Vomiting, unspecified: Secondary | ICD-10-CM | POA: Diagnosis not present

## 2024-02-29 DIAGNOSIS — I517 Cardiomegaly: Secondary | ICD-10-CM | POA: Insufficient documentation

## 2024-02-29 DIAGNOSIS — R55 Syncope and collapse: Secondary | ICD-10-CM | POA: Diagnosis present

## 2024-02-29 LAB — CBC WITH DIFFERENTIAL/PLATELET
Abs Immature Granulocytes: 0.03 10*3/uL (ref 0.00–0.07)
Basophils Absolute: 0 10*3/uL (ref 0.0–0.1)
Basophils Relative: 0 %
Eosinophils Absolute: 0.1 10*3/uL (ref 0.0–0.5)
Eosinophils Relative: 1 %
HCT: 43.2 % (ref 36.0–46.0)
Hemoglobin: 13 g/dL (ref 12.0–15.0)
Immature Granulocytes: 0 %
Lymphocytes Relative: 17 %
Lymphs Abs: 1.4 10*3/uL (ref 0.7–4.0)
MCH: 29.8 pg (ref 26.0–34.0)
MCHC: 30.1 g/dL (ref 30.0–36.0)
MCV: 99.1 fL (ref 80.0–100.0)
Monocytes Absolute: 0.4 10*3/uL (ref 0.1–1.0)
Monocytes Relative: 5 %
Neutro Abs: 6.1 10*3/uL (ref 1.7–7.7)
Neutrophils Relative %: 77 %
Platelets: 181 10*3/uL (ref 150–400)
RBC: 4.36 MIL/uL (ref 3.87–5.11)
RDW: 14.1 % (ref 11.5–15.5)
WBC: 8.1 10*3/uL (ref 4.0–10.5)
nRBC: 0 % (ref 0.0–0.2)

## 2024-02-29 LAB — RESP PANEL BY RT-PCR (RSV, FLU A&B, COVID)  RVPGX2
Influenza A by PCR: NEGATIVE
Influenza B by PCR: NEGATIVE
Resp Syncytial Virus by PCR: NEGATIVE
SARS Coronavirus 2 by RT PCR: NEGATIVE

## 2024-02-29 LAB — COMPREHENSIVE METABOLIC PANEL
ALT: 27 U/L (ref 0–44)
AST: 33 U/L (ref 15–41)
Albumin: 3.7 g/dL (ref 3.5–5.0)
Alkaline Phosphatase: 80 U/L (ref 38–126)
Anion gap: 7 (ref 5–15)
BUN: 15 mg/dL (ref 8–23)
CO2: 21 mmol/L — ABNORMAL LOW (ref 22–32)
Calcium: 8.7 mg/dL — ABNORMAL LOW (ref 8.9–10.3)
Chloride: 110 mmol/L (ref 98–111)
Creatinine, Ser: 0.63 mg/dL (ref 0.44–1.00)
GFR, Estimated: >60 mL/min (ref >60)
Glucose, Bld: 135 mg/dL — ABNORMAL HIGH (ref 70–99)
Potassium: 4.7 mmol/L (ref 3.5–5.1)
Sodium: 138 mmol/L (ref 135–145)
Total Bilirubin: 0.9 mg/dL (ref 0.0–1.2)
Total Protein: 6.5 g/dL (ref 6.5–8.1)

## 2024-02-29 LAB — CBG MONITORING, ED: Glucose-Capillary: 105 mg/dL — ABNORMAL HIGH (ref 70–99)

## 2024-02-29 LAB — LACTIC ACID, PLASMA: Lactic Acid, Venous: 1.1 mmol/L (ref 0.5–1.9)

## 2024-02-29 LAB — TROPONIN I (HIGH SENSITIVITY)
Troponin I (High Sensitivity): 8 ng/L (ref <18)
Troponin I (High Sensitivity): 7 ng/L (ref <18)

## 2024-02-29 MED ORDER — SODIUM CHLORIDE 0.9 % IV BOLUS
1000.0000 mL | Freq: Once | INTRAVENOUS | Status: AC
  Administered 2024-02-29: 1000 mL via INTRAVENOUS

## 2024-02-29 MED ORDER — ONDANSETRON 4 MG PO TBDP
ORAL_TABLET | ORAL | 0 refills | Status: DC
Start: 2024-02-29 — End: 2024-06-21

## 2024-02-29 NOTE — Discharge Instructions (Signed)
 Drink plenty of fluids.  Rest over the next couple days.  Follow-up with your family doctor if not improving

## 2024-02-29 NOTE — ED Triage Notes (Signed)
 BIB EMS from home for syncopal episode at home. Now c/o N/V/D. No use of blood thinners

## 2024-02-29 NOTE — ED Provider Notes (Signed)
 [place] EMERGENCY DEPARTMENT AT [facility] Provider Note   CSN: [id number] Arrival date & time: 02/29/24  1226     History {Add pertinent medical, surgical, social history, OB history to HPI:1} Chief Complaint  Patient presents with   Loss of Consciousness    Emma Hampton is a 68 y.o. female.  Patient has a history of hyperlipidemia.  She was feeding her grandchild and then started feeling dizzy and weak and passed out.  She then had some vomiting and diarrhea.   Loss of Consciousness      Home Medications Prior to Admission medications   Medication Sig Start Date End Date Taking? Authorizing Provider  ondansetron (ZOFRAN-ODT) 4 MG disintegrating tablet 4mg  ODT q4 hours prn nausea/vomit 02/29/24  Yes Bethann Berkshire, MD  ascorbic acid (VITAMIN C) 500 MG tablet Take 500 mg by mouth daily.    [provider]  atorvastatin (LIPITOR) 20 MG tablet TAKE 1 TABLET BY MOUTH EVERY NIGHT AT BEDTIME 02/18/24   Karie Georges, MD  EPINEPHrine 0.3 mg/0.3 mL IJ SOAJ injection Inject 0.3 mg into the muscle as needed for anaphylaxis. 07/31/22   Karie Georges, MD  Multiple Vitamin (MULTIVITAMIN) tablet Take 1 tablet by mouth daily.    [provider]  Potassium Chloride ER 20 MEQ TBCR TAKE 1 TABLET(20 MEQ) BY MOUTH DAILY 02/18/24   Karie Georges, MD      Allergies    Codeine and Bee venom    Review of Systems   Review of Systems  Cardiovascular:  Positive for syncope.    Physical Exam Updated Vital Signs BP 121/74 (BP Location: Left Arm)   Pulse 71   Temp 97.8 F (36.6 C) (Oral)   Resp 15   Ht 5\' 3"  (1.6 m)   Wt 59 kg   SpO2 99%   BMI 23.03 kg/m  Physical Exam  ED Results / Procedures / Treatments   Labs (all labs ordered are listed, but only abnormal results are displayed) Labs Reviewed  COMPREHENSIVE METABOLIC PANEL - Abnormal; Notable for the following components:      Result Value   CO2 21 (*)    Glucose, Bld 135  (*)    Calcium 8.7 (*)    All other components within normal limits  CBG MONITORING, ED - Abnormal; Notable for the following components:   Glucose-Capillary 105 (*)    All other components within normal limits  RESP PANEL BY RT-PCR (RSV, FLU A&B, COVID)  RVPGX2  CBC WITH DIFFERENTIAL/PLATELET  LACTIC ACID, PLASMA  URINALYSIS, ROUTINE W REFLEX MICROSCOPIC  LACTIC ACID, PLASMA  TROPONIN I (HIGH SENSITIVITY)  TROPONIN I (HIGH SENSITIVITY)    EKG None  Radiology No results found.  Procedures Procedures  {Document cardiac monitor, telemetry assessment procedure when appropriate:1}  Medications Ordered in ED Medications  sodium chloride 0.9 % bolus 1,000 mL (0 mLs Intravenous Stopped 02/29/24 1542)    ED Course/ Medical Decision Making/ A&P   {   Click here for ABCD2, HEART and other calculatorsREFRESH Note before signing :1}                              Medical Decision Making Amount and/or Complexity of Data Reviewed Labs: ordered.  Risk Prescription drug management.  Patient with syncopal episode.  Possibly viral syndrome.  Patient improved with IV fluids and will follow-up with her family doctor.  Patient sent home with Zofran  {Document  critical care time when appropriate:1} {Document review of labs and clinical decision tools ie heart score, Chads2Vasc2 etc:1}  {Document your independent review of radiology images, and any outside records:1} {Document your discussion with family members, caretakers, and with consultants:1} {Document social determinants of health affecting pt's care:1} {Document your decision making why or why not admission, treatments were needed:1} Final Clinical Impression(s) / ED Diagnoses Final diagnoses:  Syncope and collapse    Rx / DC Orders ED Discharge Orders          Ordered    ondansetron (ZOFRAN-ODT) 4 MG disintegrating tablet        02/29/24 1629

## 2024-02-29 NOTE — ED Provider Triage Note (Addendum)
 Emergency Medicine Provider Triage Evaluation Note  Emma Hampton , a 68 y.o. female  was evaluated in triage.  Pt complains of syncope.  Pt weak and passed out.    Review of Systems  Positive: weak Negative: fever  Physical Exam  BP (!) 157/116   Pulse 73   Temp (!) 95.6 F (35.3 C) (Rectal)   Resp 18   SpO2 100%  Gen:   Awake,  Resp:  Normal effort  MSK:   Moves extremities without difficulty  Other:    Medical Decision Making  Medically screening exam initiated at 1:00 PM.  Appropriate orders placed.  Emma Hampton was informed that the remainder of the evaluation will be completed by another provider, this initial triage assessment does not replace that evaluation, and the importance of remaining in the ED until their evaluation is complete.  Covid, Influenza and RSV ordered,  Pt sick weak and passed out   Emma Areas, PA-C 02/29/24 1301    Emma Hampton, [place] 03/28/24 1356

## 2024-03-01 ENCOUNTER — Telehealth: Payer: Self-pay

## 2024-03-01 NOTE — Telephone Encounter (Signed)
 Copied from CRM [id number]. Topic: Clinical - Lab/Test Results >> Mar 01, 2024  2:37 PM Rodman Pickle T wrote: Reason for CRM: patient would like a call back to go over her labs

## 2024-03-01 NOTE — Telephone Encounter (Signed)
 Spoke with the patient as I do not see any recent labs that was ordered by PCP.  Patient stated she was calling in regards to her daughter as someone called yesterday with results.  Info added to the patient's daughter's chart as she is the guardian.

## 2024-03-02 ENCOUNTER — Other Ambulatory Visit: Payer: Self-pay | Admitting: Family Medicine

## 2024-03-02 DIAGNOSIS — Z0000 Encounter for general adult medical examination without abnormal findings: Secondary | ICD-10-CM

## 2024-03-03 ENCOUNTER — Encounter: Payer: Self-pay | Admitting: Family Medicine

## 2024-03-03 ENCOUNTER — Ambulatory Visit
Admission: RE | Admit: 2024-03-03 | Discharge: 2024-03-03 | Disposition: A | Discharge status: Home / Self Care | Source: Ambulatory Visit | Attending: Family Medicine | Admitting: Family Medicine

## 2024-03-03 ENCOUNTER — Ambulatory Visit (INDEPENDENT_AMBULATORY_CARE_PROVIDER_SITE_OTHER): Admitting: Family Medicine

## 2024-03-03 VITALS — BP 118/80 | HR 68 | Temp 97.8°F | Ht 63.0 in | Wt 132.0 lb

## 2024-03-03 DIAGNOSIS — Z0000 Encounter for general adult medical examination without abnormal findings: Secondary | ICD-10-CM

## 2024-03-03 DIAGNOSIS — M25532 Pain in left wrist: Secondary | ICD-10-CM

## 2024-03-03 DIAGNOSIS — D229 Melanocytic nevi, unspecified: Secondary | ICD-10-CM | POA: Diagnosis not present

## 2024-03-03 DIAGNOSIS — W19XXXA Unspecified fall, initial encounter: Secondary | ICD-10-CM | POA: Diagnosis not present

## 2024-03-03 NOTE — Progress Notes (Signed)
 Established Patient Office Visit   Subjective  Patient ID: Emma Hampton, female    DOB: 01-Jun-1956  Age: 68 y.o. MRN: [id number]  Chief Complaint  Patient presents with   Hand Injury    Patient came in today for a left hand injury, happened a week ago, patient is having swelling and some pain   Follow-up    ER follow-up, patient was seen 3/10 for dizziness and nausea and vomiting    Pt is a 68 yo female seen for ongoing concern. Pt states she hit her L hand on something a few wks ago when she fell in her yard gardening.  Landed on her knees.  Hand is swollen, stiff, and sore.  Tried salonpas.  Pt seen in ED last wk s/p syncopal episode.  Patient states her temperature was low and she was given a heated blanket and IV fluids.  Advised it was related to a viral illness after testing was negative.  States did not feel sick.  Pt did not mention L hand during visit.  Patient also mentions an area in left frontal scalp with a bump that is recently appeared.    Patient Active Problem List   Diagnosis Date Noted   Prediabetes 08/01/2022   Allergy to bee sting 07/31/2022   Postural dizziness with presyncope 07/31/2022   Meningitis, viral 09/10/2013   Past Medical History:  Diagnosis Date   Diabetes mellitus without complication (HCC)    Fibroids    Retroperitoneal abscess (HCC)    Past Surgical History:  Procedure Laterality Date   BREAST BIOPSY Right    CESAREAN SECTION     CYST EXCISION     TUBAL LIGATION     Social History   Tobacco Use   Smoking status: Former    Current packs/day: 0.00    Types: Cigarettes    Quit date: 11/03/2015    Years since quitting: 8.3   Smokeless tobacco: Never  Vaping Use   Vaping status: Former  Substance Use Topics   Alcohol use: Yes    Alcohol/week: 1.0 standard drink of alcohol    Types: 1 Glasses of wine per week   Drug use: No   Family History  Problem Relation Age of Onset   Heart disease Mother        MI   Cancer  Daughter        BREAST   LEFT MASECT   Breast cancer Daughter    Diabetes Maternal Uncle    Prostate cancer Maternal Uncle    Heart attack Maternal Grandmother    Dementia Maternal Grandfather    Diabetes Other    Allergies  Allergen Reactions   Codeine Nausea And Vomiting   Bee Venom       ROS Negative unless stated above    Objective:     BP 118/80 (BP Location: Left Arm, Patient Position: Sitting, Cuff Size: Normal)   Pulse 68   Temp 97.8 F (36.6 C) (Oral)   Ht 5\' 3"  (1.6 m)   Wt 132 lb (59.9 kg)   SpO2 97%   BMI 23.38 kg/m  BP Readings from Last 3 Encounters:  03/03/24 118/80  02/29/24 121/74  01/07/24 118/72   Wt Readings from Last 3 Encounters:  03/03/24 132 lb (59.9 kg)  02/29/24 130 lb (59 kg)  01/07/24 127 lb 9.6 oz (57.9 kg)      Physical Exam Constitutional:      Appearance: Normal appearance.  HENT:  Head: Normocephalic and atraumatic.     Mouth/Throat:     Mouth: Mucous membranes are moist.  Eyes:     Extraocular Movements: Extraocular movements intact.     Conjunctiva/sclera: Conjunctivae normal.     Pupils: Pupils are equal, round, and reactive to light.  Cardiovascular:     Rate and Rhythm: Normal rate.     Pulses: Normal pulses.     Heart sounds: Normal heart sounds.  Pulmonary:     Effort: Pulmonary effort is normal.  Musculoskeletal:     Right hand: Normal.     Left hand: Swelling and tenderness present.     Comments: Mild edema and tenderness at base of left thumb.  F ROM of bilateral hands.  Skin:    General: Skin is warm and dry.     Comments: Area of faint hyperpigmentation in left frontal scalp visible through care with a 8 mm raised hyperpigmented nodule.  Neurological:     Mental Status: She is alert.      No results found for any visits on 03/03/24.    Assessment & Plan:  Left wrist pain -     DG Wrist Complete Left; Future  Fall from standing, initial encounter  Nevus  Patient with continued left  wrist pain times fall few weeks ago.  Unsure but may have hit her hand against something during the fall.  Discussed obtaining x-ray.  Advised wrist fractures may remain negative on x-ray for several weeks after incident.  Repeat imaging for continued or worsening symptoms in the next 3 weeks.  Discussed fall prevention.  Area on scalp appears to be a nevus.  Patient advised to continue monitoring area.  Consider referral to dermatology for any acute changes.  Follow-up with PCP.  Return if symptoms worsen or fail to improve.   Deeann Saint, MD

## 2024-03-04 ENCOUNTER — Ambulatory Visit (INDEPENDENT_AMBULATORY_CARE_PROVIDER_SITE_OTHER)
Admission: RE | Admit: 2024-03-04 | Discharge: 2024-03-04 | Disposition: A | Discharge status: Home / Self Care | Source: Ambulatory Visit | Attending: Family Medicine | Admitting: Family Medicine

## 2024-03-04 DIAGNOSIS — M25532 Pain in left wrist: Secondary | ICD-10-CM

## 2024-04-12 ENCOUNTER — Ambulatory Visit (INDEPENDENT_AMBULATORY_CARE_PROVIDER_SITE_OTHER): Payer: Medicare Other | Admitting: Family Medicine

## 2024-04-12 DIAGNOSIS — Z0000 Encounter for general adult medical examination without abnormal findings: Secondary | ICD-10-CM | POA: Diagnosis not present

## 2024-04-12 NOTE — Progress Notes (Signed)
 PATIENT CHECK-IN and HEALTH RISK ASSESSMENT QUESTIONNAIRE:  -completed by phone/video for upcoming Medicare Preventive Visit  Pre-Visit Check-in: 1)Vitals (height, wt, BP, etc) - record in vitals section for visit on day of visit Request home vitals (wt, BP, etc.) and enter into vitals, THEN update Vital Signs SmartPhrase below at the top of the HPI. See below.  2)Review and Update Medications, Allergies PMH, Surgeries, Social history in Epic 3)Hospitalizations in the last year with date/reason? n  4)Review and Update Care Team (patient's specialists) in Epic 5) Complete PHQ9 in Epic  6) Complete Fall Screening in Epic 7)Review all Health Maintenance Due and order under PCP if not done.  Medicare Wellness Patient Questionnaire:  Answer theses question about your habits: How often do you have a drink containing alcohol? wine How many drinks containing alcohol do you have on a typical day when you are drinking? 1 glass in any given day - 1 per 1-2 weeks Have you ever smoked? Quit date if applicable? Quit in 1980s  How many packs a day do/did you smoke? 1 cig per day Do you use smokeless tobacco?n Do you use an illicit drugs?n On average, how many days per week do you engage in moderate to strenuous exercise (like a brisk walk)?walking at mall? On average, how many minutes do you engage in exercise at this level? 1.5 hours per day outside or indoors if the weather is bad Typical breakfast: proteins, grains, fruits Typical lunch and dinner: fish or chicken, veggies Typical snacks: popcorn, fruit, sometimes oreos without the cream  Beverages: lots of water, some ginger beer or cold pressed apple juice, ginger and tumeric smoothies  Answer theses question about your everyday activities: Can you perform most household chores?y Are you deaf or have significant trouble hearing?n Do you feel that you have a problem with memory?n Do you feel safe at home?y Last dentist visit?has appt coming  up in the week, goes every 6 weeks 8. Do you have any difficulty performing your everyday activities?n Are you having any difficulty walking, taking medications on your own, and or difficulty managing daily home needs?n Do you have difficulty walking or climbing stairs?n Do you have difficulty dressing or bathing?n Do you have difficulty doing errands alone such as visiting a doctor's office or shopping?n Do you currently have any difficulty preparing food and eating?n Do you currently have any difficulty using the toilet?n Do you have any difficulty managing your finances?n Do you have any difficulties with housekeeping of managing your housekeeping?n   Do you have Advanced Directives in place (Living Will, Healthcare Power or Attorney)? y   Last eye Exam and location?[facility] on [address] - has appt coming up in May   Do you currently use prescribed or non-prescribed narcotic or opioid pain medications? n  Do you have a history or close family history of breast, ovarian, tubal or peritoneal cancer or a family member with BRCA (breast cancer susceptibility 1 and 2) gene mutations? Daughter has breast ca     ----------------------------------------------------------------------------------------------------------------------------------------------------------------------------------------------------------------------  Because this visit was a virtual/telehealth visit, some criteria may be missing or patient reported. Any vitals not documented were not able to be obtained and vitals that have been documented are patient reported.    MEDICARE ANNUAL PREVENTIVE VISIT WITH PROVIDER: (Welcome to Medicare, initial annual wellness or annual wellness exam)  Virtual Visit via Video Note  I connected with Emma Hampton on 04/12/24 by a video enabled telemedicine application and verified that I am speaking  with the correct person using two identifiers.  Location  patient: home Location provider:work or home office Persons participating in the virtual visit: patient, provider  Concerns and/or follow up today:  reports is doing well, saw Dr. Arliss Lam with wrist pain and will follow up with her. Doing ok.    See HM section in Epic for other details of completed HM.    ROS: negative for report of fevers, unintentional weight loss, vision changes, vision loss, hearing loss or change, chest pain, sob, hemoptysis, melena, hematochezia, hematuria, falls, bleeding or bruising, thoughts of suicide or self harm, memory loss  Patient-completed extensive health risk assessment - reviewed and discussed with the patient: See Health Risk Assessment completed with patient prior to the visit either above or in recent phone note. This was reviewed in detailed with the patient today and appropriate recommendations, orders and referrals were placed as needed per Summary below and patient instructions.   Review of Medical History: -PMH, PSH, Family History and current specialty and care providers reviewed and updated and listed below   Patient Care Team: Aida House, MD as PCP - General (Family Medicine)   Past Medical History:  Diagnosis Date   Diabetes mellitus without complication (HCC)    Fibroids    Retroperitoneal abscess [facility])     Past Surgical History:  Procedure Laterality Date   BREAST BIOPSY Right    CESAREAN SECTION     CYST EXCISION     TUBAL LIGATION      Social History   Socioeconomic History   Marital status: Widowed    Spouse name: Not on file   Number of children: Not on file   Years of education: Not on file   Highest education level: Associate degree: occupational, Scientist, product/process development, or vocational program  Occupational History   Not on file  Tobacco Use   Smoking status: Former    Current packs/day: 0.00    Types: Cigarettes    Quit date: 11/03/2015    Years since quitting: 8.4   Smokeless tobacco: Never  Vaping Use   Vaping  status: Former  Substance and Sexual Activity   Alcohol use: Yes    Alcohol/week: 1.0 standard drink of alcohol    Types: 1 Glasses of wine per week   Drug use: No   Sexual activity: Yes    Birth control/protection: Post-menopausal, None  Other Topics Concern   Not on file  Social History Narrative   Not on file   Social Drivers of Health   Financial Resource Strain: High Risk (05/03/2023)   Overall Financial Resource Strain (CARDIA)    Difficulty of Paying Living Expenses: Hard  Food Insecurity: Food Insecurity Present (05/03/2023)   Hunger Vital Sign    Worried About Running Out of Food in the Last Year: Patient declined    Ran Out of Food in the Last Year: Sometimes true  Transportation Needs: No Transportation Needs (05/03/2023)   PRAPARE - Administrator, Civil Service (Medical): No    Lack of Transportation (Non-Medical): No  Physical Activity: Insufficiently Active (05/03/2023)   Exercise Vital Sign    Days of Exercise per Week: 3 days    Minutes of Exercise per Session: 40 min  Stress: Stress Concern Present (05/03/2023)   [employer] of Occupational Health - Occupational Stress Questionnaire    Feeling of Stress : To some extent  Social Connections: Unknown (05/03/2023)   Social Connection and Isolation Panel [NHANES]    Frequency of Communication with  Friends and Family: Once a week    Frequency of Social Gatherings with Friends and Family: Once a week    Attends Religious Services: Patient declined    Database administrator or Organizations: No    Attends Engineer, structural: Not on file    Marital Status: Widowed  Catering manager Violence: Not on file    Family History  Problem Relation Age of Onset   Heart disease Mother        MI   Cancer Daughter        BREAST   LEFT MASECT   Breast cancer Daughter    Diabetes Maternal Uncle    Prostate cancer Maternal Uncle    Heart attack Maternal Grandmother    Dementia Maternal Grandfather     Diabetes Other     Current Outpatient Medications on File Prior to Visit  Medication Sig Dispense Refill   ascorbic acid (VITAMIN C) 500 MG tablet Take 500 mg by mouth daily.     atorvastatin  (LIPITOR) 20 MG tablet TAKE 1 TABLET BY MOUTH EVERY NIGHT AT BEDTIME 90 tablet 0   EPINEPHrine  0.3 mg/0.3 mL IJ SOAJ injection Inject 0.3 mg into the muscle as needed for anaphylaxis. 1 each 1   Multiple Vitamin (MULTIVITAMIN) tablet Take 1 tablet by mouth daily.     ondansetron  (ZOFRAN -ODT) 4 MG disintegrating tablet 4mg  ODT q4 hours prn nausea/vomit 12 tablet 0   Potassium Chloride  ER 20 MEQ TBCR TAKE 1 TABLET(20 MEQ) BY MOUTH DAILY 90 tablet 0   No current facility-administered medications on file prior to visit.    Allergies  Allergen Reactions   Codeine Nausea And Vomiting   Bee Venom        Physical Exam Vitals requested from patient and listed below if patient had equipment and was able to obtain at home for this virtual visit: There were no vitals filed for this visit. Estimated body mass index is 23.38 kg/m as calculated from the following:   Height as of 03/03/24: 5\' 3"  (1.6 m).   Weight as of 03/03/24: 132 lb (59.9 kg).  EKG (optional): deferred due to virtual visit  GENERAL: alert, oriented, no acute distress detected, full vision exam deferred due to pandemic and/or virtual encounter  HEENT: atraumatic, conjunttiva clear, no obvious abnormalities on inspection of external nose and ears  NECK: normal movements of the head and neck  LUNGS: on inspection no signs of respiratory distress, breathing rate appears normal, no obvious gross SOB, gasping or wheezing  CV: no obvious cyanosis  MS: moves all visible extremities without noticeable abnormality  PSYCH/NEURO: pleasant and cooperative, no obvious depression or anxiety, speech and thought processing grossly intact, Cognitive function grossly intact  Flowsheet Row Office Visit from 03/03/2024 in [facility]  [facility]  PHQ-9 Total Score 6           04/12/2024   12:29 PM 03/03/2024    1:04 PM 01/07/2024    4:23 PM 09/02/2023    2:29 PM 03/10/2023    3:27 PM  Depression screen PHQ 2/9  Decreased Interest 0 1 2 1  0  Down, Depressed, Hopeless 0 1 0 0 0  PHQ - 2 Score 0 2 2 1  0  Altered sleeping  1 2 1 2   Tired, decreased energy  1 2 0 2  Change in appetite  1 1 1 2   Feeling bad or failure about yourself   0 0 0 0  Trouble concentrating  1 1 1  0  Moving slowly or fidgety/restless  0 0 0 0  Suicidal thoughts  0 0 0 0  PHQ-9 Score  6 8 4 6   Difficult doing work/chores  Somewhat difficult  Not difficult at all Somewhat difficult       05/05/2023   11:01 AM 09/02/2023    2:28 PM 01/07/2024    4:23 PM 03/03/2024    1:04 PM 04/12/2024   12:28 PM  Fall Risk  Falls in the past year? 0 0 0 0 0  Was there an injury with Fall? 0 0 0 0 0  Fall Risk Category Calculator 0 0 0 0 0  Patient at Risk for Falls Due to No Fall Risks No Fall Risks     Fall risk Follow up Falls evaluation completed Falls evaluation completed Falls evaluation completed       SUMMARY AND PLAN:  Encounter for Medicare annual wellness exam   Discussed applicable health maintenance/preventive health measures and advised and referred or ordered per patient preferences: -discussed covid vaccine recs/risks and advised she can get at the pharmacy if wishes to do Health Maintenance  Topic Date Due   COVID-19 Vaccine (6 - 2024-25 season) 08/23/2023   Hepatitis C Screening  01/06/2025 (Originally 08/12/1974)   INFLUENZA VACCINE  07/22/2024   Medicare Annual Wellness (AWV)  04/12/2025   MAMMOGRAM  03/03/2026   Colonoscopy  07/31/2030   DTaP/Tdap/Td (2 - Td or Tdap) 06/01/2033   Pneumonia Vaccine 63+ Years old  Completed   DEXA SCAN  Completed   Zoster Vaccines- Shingrix  Completed   HPV VACCINES  Aged Out   Meningococcal B Vaccine  Aged Out      Education and counseling on the following was provided  based on the above review of health and a plan/checklist for the patient, along with additional information discussed, was provided for the patient in the patient instructions :   -Advised and counseled on a healthy lifestyle - including the importance of a healthy diet, regular physical activity, social connections and stress management. -Reviewed patient's current diet. Advised and counseled on a whole foods based healthy diet. A summary of a healthy diet was provided in the Patient Instructions.  -reviewed patient's current physical activity level and discussed exercise guidelines for adults. Discussed community resources and ideas for safe exercise at home to assist in meeting exercise guideline recommendations in a safe and healthy way.  -Advise yearly dental visits at minimum and regular eye exams -Advised and counseled on alcohol safe limits, risks  Follow up: see patient instructions     Patient Instructions  I really enjoyed getting to talk with you today! I am available on Tuesdays and Thursdays for virtual visits if you have any questions or concerns, or if I can be of any further assistance.   CHECKLIST FROM ANNUAL WELLNESS VISIT:  -Follow up (please call to schedule if not scheduled after visit):   -yearly for annual wellness visit with primary care office  Here is a list of your preventive care/health maintenance measures and the plan for each if any are due:  PLAN For any measures below that may be due:   Health Maintenance  Topic Date Due   COVID-19 Vaccine (6 - 2024-25 season) 08/23/2023   Hepatitis C Screening  01/06/2025 (Originally 08/12/1974)   INFLUENZA VACCINE  07/22/2024   Medicare Annual Wellness (AWV)  04/12/2025   MAMMOGRAM  03/03/2026   Colonoscopy  07/31/2030   DTaP/Tdap/Td (2 - Td  or Tdap) 06/01/2033   Pneumonia Vaccine 58+ Years old  Completed   DEXA SCAN  Completed   Zoster Vaccines- Shingrix  Completed   HPV VACCINES  Aged Out   Meningococcal B  Vaccine  Aged Out    -See a dentist at least yearly  -Get your eyes checked and then per your eye specialist's recommendations  -Other issues addressed today:   -I have included below further information regarding a healthy whole foods based diet, physical activity guidelines for adults, stress management and opportunities for social connections. I hope you find this information useful.   -----------------------------------------------------------------------------------------------------------------------------------------------------------------------------------------------------------------------------------------------------------    NUTRITION: -eat real food: lots of colorful vegetables (half the plate) and fruits -5-7 servings of vegetables and fruits per day (fresh or steamed is best), exp. 2 servings of vegetables with lunch and dinner and 2 servings of fruit per day. Berries and greens such as kale and collards are great choices.  -consume on a regular basis:  fresh fruits, fresh veggies, fish, nuts, seeds, healthy oils (such as olive oil, avocado oil), whole grains (make sure for bread/pasta/crackers/etc., that the first ingredient on label contains the word "whole"), legumes. -can eat small amounts of dairy and lean meat (no larger than the palm of your hand), but avoid processed meats such as ham, bacon, lunch meat, etc. -drink water -try to avoid fast food and pre-packaged foods, processed meat, ultra processed foods/beverages (donuts, candy, etc.) -most experts advise limiting sodium to < 2300mg  per day, should limit further is any chronic conditions such as high blood pressure, heart disease, diabetes, etc. The American Heart Association advised that < 1500mg  is is ideal -try to avoid foods/beverages that contain any ingredients with names you do not recognize  -try to avoid foods/beverages  with added sugar or sweeteners/sweets  -try to avoid sweet drinks (including diet  drinks): soda, juice, Gatorade, sweet tea, power drinks, diet drinks -try to avoid white rice, white bread, pasta (unless whole grain)  EXERCISE GUIDELINES FOR ADULTS: -if you wish to increase your physical activity, do so gradually and with the approval of your doctor -STOP and seek medical care immediately if you have any chest pain, chest discomfort or trouble breathing when starting or increasing exercise  -move and stretch your body, legs, feet and arms when sitting for long periods -Physical activity guidelines for optimal health in adults: -get at least 150 minutes per week of moderate exercise (can talk, but not sing); this is about 20-30 minutes of sustained activity 5-7 days per week or two 10-15 minute episodes of sustained activity 5-7 days per week -do some muscle building/resistance training/strength training at least 2 days per week  -balance exercises 3+ days per week:   Stand somewhere where you have something sturdy to hold onto if you lose balance    1) lift up on toes, then back down, start with 5x per day and work up to 20x   2) stand and lift one leg straight out to the side so that foot is a few inches of the floor, start with 5x each side and work up to 20x each side   3) stand on one foot, start with 5 seconds each side and work up to 20 seconds on each side  If you need ideas or help with getting more active:  -Silver sneakers https://tools.silversneakers.com  -Walk with a Doc: http://www.duncan-williams.com/  -try to include resistance (weight lifting/strength building) and balance exercises twice per week: or the following link for ideas: http://castillo-powell.com/  BuyDucts.dk  STRESS MANAGEMENT: -can try meditating, or just sitting quietly with deep breathing while intentionally relaxing all parts of your body for 5 minutes daily -if you need further help with stress,  anxiety or depression please follow up with your primary doctor or contact the wonderful folks at [facility]: [phone]  SOCIAL CONNECTIONS: -options in [place] if you wish to engage in more social and exercise related activities:  -Silver sneakers https://tools.silversneakers.com  -Walk with a Doc: http://www.duncan-williams.com/  -Check out the [facility] Active Adults 50+ section on the [place] [employer] (hiking clubs, book clubs, cards and games, chess, exercise classes, aquatic classes and much more) - see the website for details: https://www.[place].gov/departments/parks-recreation/active-adults50  -YouTube has lots of exercise videos for different ages and abilities as well  -[facility] (a variety of indoor and outdoor inperson activities for adults). [phone]. [address].  -Virtual Online Classes (a variety of topics): see seniorplanet.org or call [phone]  -consider volunteering at a school, hospice center, church, senior center or elsewhere            Maurie Southern, DO

## 2024-04-12 NOTE — Patient Instructions (Signed)
 I really enjoyed getting to talk with you today! I am available on Tuesdays and Thursdays for virtual visits if you have any questions or concerns, or if I can be of any further assistance.   CHECKLIST FROM ANNUAL WELLNESS VISIT:  -Follow up (please call to schedule if not scheduled after visit):   -yearly for annual wellness visit with primary care office  Here is a list of your preventive care/health maintenance measures and the plan for each if any are due:  PLAN For any measures below that may be due:   Health Maintenance  Topic Date Due   COVID-19 Vaccine (6 - 2024-25 season) 08/23/2023   Hepatitis C Screening  01/06/2025 (Originally 08/12/1974)   INFLUENZA VACCINE  07/22/2024   Medicare Annual Wellness (AWV)  04/12/2025   MAMMOGRAM  03/03/2026   Colonoscopy  07/31/2030   DTaP/Tdap/Td (2 - Td or Tdap) 06/01/2033   Pneumonia Vaccine 9+ Years old  Completed   DEXA SCAN  Completed   Zoster Vaccines- Shingrix  Completed   HPV VACCINES  Aged Out   Meningococcal B Vaccine  Aged Out    -See a dentist at least yearly  -Get your eyes checked and then per your eye specialist's recommendations  -Other issues addressed today:   -I have included below further information regarding a healthy whole foods based diet, physical activity guidelines for adults, stress management and opportunities for social connections. I hope you find this information useful.   -----------------------------------------------------------------------------------------------------------------------------------------------------------------------------------------------------------------------------------------------------------    NUTRITION: -eat real food: lots of colorful vegetables (half the plate) and fruits -5-7 servings of vegetables and fruits per day (fresh or steamed is best), exp. 2 servings of vegetables with lunch and dinner and 2 servings of fruit per day. Berries and greens such as kale and  collards are great choices.  -consume on a regular basis:  fresh fruits, fresh veggies, fish, nuts, seeds, healthy oils (such as olive oil, avocado oil), whole grains (make sure for bread/pasta/crackers/etc., that the first ingredient on label contains the word "whole"), legumes. -can eat small amounts of dairy and lean meat (no larger than the palm of your hand), but avoid processed meats such as ham, bacon, lunch meat, etc. -drink water -try to avoid fast food and pre-packaged foods, processed meat, ultra processed foods/beverages (donuts, candy, etc.) -most experts advise limiting sodium to < 2300mg  per day, should limit further is any chronic conditions such as high blood pressure, heart disease, diabetes, etc. The American Heart Association advised that < 1500mg  is is ideal -try to avoid foods/beverages that contain any ingredients with names you do not recognize  -try to avoid foods/beverages  with added sugar or sweeteners/sweets  -try to avoid sweet drinks (including diet drinks): soda, juice, Gatorade, sweet tea, power drinks, diet drinks -try to avoid white rice, white bread, pasta (unless whole grain)  EXERCISE GUIDELINES FOR ADULTS: -if you wish to increase your physical activity, do so gradually and with the approval of your doctor -STOP and seek medical care immediately if you have any chest pain, chest discomfort or trouble breathing when starting or increasing exercise  -move and stretch your body, legs, feet and arms when sitting for long periods -Physical activity guidelines for optimal health in adults: -get at least 150 minutes per week of moderate exercise (can talk, but not sing); this is about 20-30 minutes of sustained activity 5-7 days per week or two 10-15 minute episodes of sustained activity 5-7 days per week -do some muscle building/resistance training/strength training  at least 2 days per week  -balance exercises 3+ days per week:   Stand somewhere where you have  something sturdy to hold onto if you lose balance    1) lift up on toes, then back down, start with 5x per day and work up to 20x   2) stand and lift one leg straight out to the side so that foot is a few inches of the floor, start with 5x each side and work up to 20x each side   3) stand on one foot, start with 5 seconds each side and work up to 20 seconds on each side  If you need ideas or help with getting more active:  -Silver sneakers https://tools.silversneakers.com  -Walk with a Doc: http://www.duncan-williams.com/  -try to include resistance (weight lifting/strength building) and balance exercises twice per week: or the following link for ideas: http://castillo-powell.com/  BuyDucts.dk  STRESS MANAGEMENT: -can try meditating, or just sitting quietly with deep breathing while intentionally relaxing all parts of your body for 5 minutes daily -if you need further help with stress, anxiety or depression please follow up with your primary doctor or contact the wonderful folks at [facility]: [phone]  SOCIAL CONNECTIONS: -options in [place] if you wish to engage in more social and exercise related activities:  -Silver sneakers https://tools.silversneakers.com  -Walk with a Doc: http://www.duncan-williams.com/  -Check out the [facility] Active Adults 50+ section on the [place] [employer] (hiking clubs, book clubs, cards and games, chess, exercise classes, aquatic classes and much more) - see the website for details: https://www.[place] [place].gov/departments/parks-recreation/active-adults50  -YouTube has lots of exercise videos for different ages and abilities as well  -[facility] (a variety of indoor and outdoor inperson activities for adults). [phone]. [address].  -Virtual Online Classes (a variety of topics): see seniorplanet.org or call  [phone]  -consider volunteering at a school, hospice center, church, senior center or elsewhere

## 2024-05-13 ENCOUNTER — Other Ambulatory Visit: Payer: Self-pay | Admitting: Family Medicine

## 2024-05-13 DIAGNOSIS — E782 Mixed hyperlipidemia: Secondary | ICD-10-CM

## 2024-05-13 DIAGNOSIS — E876 Hypokalemia: Secondary | ICD-10-CM

## 2024-06-20 ENCOUNTER — Other Ambulatory Visit: Payer: Self-pay | Admitting: Family Medicine

## 2024-06-20 ENCOUNTER — Ambulatory Visit: Payer: Self-pay

## 2024-06-20 DIAGNOSIS — E782 Mixed hyperlipidemia: Secondary | ICD-10-CM

## 2024-06-20 DIAGNOSIS — E876 Hypokalemia: Secondary | ICD-10-CM

## 2024-06-20 MED ORDER — POTASSIUM CHLORIDE ER 20 MEQ PO TBCR: EXTENDED_RELEASE_TABLET | ORAL | 0 refills | Status: DC

## 2024-06-20 MED ORDER — ATORVASTATIN CALCIUM 20 MG PO TABS: 20.0000 mg | ORAL_TABLET | Freq: Every day | ORAL | 0 refills | Status: DC

## 2024-06-20 NOTE — Telephone Encounter (Signed)
       FYI Only or Action Required?: FYI only for provider.  Patient was last seen in primary care on 04/12/2024 by Luke Chiquita SAUNDERS, DO. Called Nurse Triage reporting Arm Pain. Symptoms began several months ago. Interventions attempted: Nothing. Symptoms are: unchanged.  Triage Disposition: See HCP Within 4 Hours (Or PCP Triage)  Patient/caregiver understands and will follow disposition?: YesCopied from CRM [id number]. Topic: Clinical - Red Word Triage >> Jun 20, 2024  8:54 AM Mesmerise C wrote: [facility] that prompted transfer to Nurse Triage: patient is experiencing excruciating pain in left arm can move in certain directions believes it may be broken in the wrist area has had an xray before but there's continue swelling Reason for Disposition  Weakness (i.e., loss of strength) in hand or fingers  (Exception: Not truly weak; hand feels weak because of pain.)  Answer Assessment - Initial Assessment Questions 1. ONSET: When did the pain start?     Over 8 weeks now 2. LOCATION: Where is the pain located?     Left arm 3. PAIN: How bad is the pain? (Scale 1-10; or mild, moderate, severe)   - MILD (1-3): Doesn't interfere with normal activities.   - MODERATE (4-7): Interferes with normal activities (e.g., work or school) or awakens from sleep.   - SEVERE (8-10): Excruciating pain, unable to do any normal activities, unable to hold a cup of water.     severe 4. WORK OR EXERCISE: Has there been any recent work or exercise that involved this part of the body?     denies 5. CAUSE: What do you think is causing the arm pain?     Unknown; states maybe broken 6. OTHER SYMPTOMS: Do you have any other symptoms? (e.g., neck pain, swelling, rash, fever, numbness, weakness)     Weakness, swelling 7. PREGNANCY: Is there any chance you are pregnant? When was your last menstrual period?     na  Protocols used: Arm Pain-A-AH

## 2024-06-20 NOTE — Telephone Encounter (Signed)
 Copied from CRM [id number]. Topic: Clinical - Medication Refill >> Jun 20, 2024  8:51 AM Mesmerise C wrote: Medication:  Potassium Chloride  ER 20 MEQ TBCR atorvastatin  (LIPITOR) 20 MG tablet   Has the patient contacted their pharmacy? No (Agent: If no, request that the patient contact the pharmacy for the refill. If patient does not wish to contact the pharmacy document the reason why and proceed with request.) (Agent: If yes, when and what did the pharmacy advise?)  This is the patient's preferred pharmacy:  [facility] [address] [place] - [address] [place] [address] Phone: [phone] Fax: [phone]  Is this the correct pharmacy for this prescription? Yes If no, delete pharmacy and type the correct one.   Has the prescription been filled recently? No  Is the patient out of the medication? No  Has the patient been seen for an appointment in the last year OR does the patient have an upcoming appointment? Yes  Can we respond through MyChart? No  Agent: Please be advised that Rx refills may take up to 3 business days. We ask that you follow-up with your pharmacy.

## 2024-06-20 NOTE — Telephone Encounter (Signed)
 Noted- ok to close.

## 2024-06-21 ENCOUNTER — Ambulatory Visit: Payer: Self-pay | Admitting: Family Medicine

## 2024-06-21 ENCOUNTER — Encounter: Payer: Self-pay | Admitting: Family Medicine

## 2024-06-21 ENCOUNTER — Ambulatory Visit (INDEPENDENT_AMBULATORY_CARE_PROVIDER_SITE_OTHER): Admitting: Family Medicine

## 2024-06-21 VITALS — BP 130/72 | HR 80 | Temp 97.9°F | Ht 63.0 in | Wt 128.6 lb

## 2024-06-21 DIAGNOSIS — E876 Hypokalemia: Secondary | ICD-10-CM | POA: Diagnosis not present

## 2024-06-21 DIAGNOSIS — R7303 Prediabetes: Secondary | ICD-10-CM | POA: Diagnosis not present

## 2024-06-21 DIAGNOSIS — E782 Mixed hyperlipidemia: Secondary | ICD-10-CM | POA: Insufficient documentation

## 2024-06-21 DIAGNOSIS — M654 Radial styloid tenosynovitis [de Quervain]: Secondary | ICD-10-CM | POA: Diagnosis not present

## 2024-06-21 LAB — LIPID PANEL
Cholesterol: 166 mg/dL (ref 0–200)
HDL: 69.9 mg/dL (ref >39.00)
LDL Cholesterol: 82 mg/dL (ref 0–99)
NonHDL: 95.89
Total CHOL/HDL Ratio: 2
Triglycerides: 71 mg/dL (ref 0.0–149.0)
VLDL: 14.2 mg/dL (ref 0.0–40.0)

## 2024-06-21 LAB — HEMOGLOBIN A1C: Hgb A1c MFr Bld: 6.2 % (ref 4.6–6.5)

## 2024-06-21 MED ORDER — ATORVASTATIN CALCIUM 20 MG PO TABS: 20.0000 mg | ORAL_TABLET | Freq: Every day | ORAL | 1 refills | Status: AC

## 2024-06-21 MED ORDER — METHYLPREDNISOLONE 4 MG PO TBPK
ORAL_TABLET | ORAL | 0 refills | Status: AC
Start: 2024-06-21 — End: ?

## 2024-06-21 MED ORDER — POTASSIUM CHLORIDE ER 20 MEQ PO TBCR: EXTENDED_RELEASE_TABLET | ORAL | 1 refills | Status: AC

## 2024-06-21 NOTE — Assessment & Plan Note (Signed)
 Needs new A1C today for surveillance, diet controlled only.

## 2024-06-21 NOTE — Assessment & Plan Note (Signed)
 On lipitor, tolerating it well, needs new lipid panel for surveillance, I have refilled her medication today

## 2024-06-21 NOTE — Assessment & Plan Note (Signed)
 Chronic, just recently had CMP in March which showed a normal potassium, will refill her medication today

## 2024-06-21 NOTE — Progress Notes (Signed)
 Established Patient Office Visit  Subjective   Patient ID: Emma Hampton, female    DOB: March 19, 1956  Age: 68 y.o. MRN: [id number]  Chief Complaint  Patient presents with   Arm Injury    Patient complains of left arm pain since injury x3 months, seen previously by Dr Mercer    Pt reports that since she hit her left hand in March while gardening. Has been taking ibuprofen and tylenol  alternating however it hasn't seemed to help, is soaking in epsom salt and warm water-- this does help but it is just temporary.  States that it hurts when she moves her thumb or tries to grip things. Also reports a cold sensation down her left arm. States she feels like she is losing grip strength in her left hand as well.  X-rays showed mild arthritis in the thumb but otherwise no other findings from March.   HLD-- pt reports compliance with her lipitor, no side effects reported today, needs refills and new lipid panel today.   preDM-- pt reports no blurry vision or neuropathy symptoms, states she has not changed her diet, is getting physical activity.     Current Outpatient Medications  Medication Instructions   ascorbic acid (VITAMIN C) 500 mg, Daily   atorvastatin  (LIPITOR) 20 mg, Oral, Daily at bedtime   EPINEPHrine  (EPI-PEN) 0.3 mg, Intramuscular, As needed   methylPREDNISolone  (MEDROL  DOSEPAK) 4 MG TBPK tablet Take package as directed.   Multiple Vitamin (MULTIVITAMIN) tablet 1 tablet, Daily   Potassium Chloride  ER 20 MEQ TBCR TAKE 1 TABLET(20 MEQ) BY MOUTH DAILY    Patient Active Problem List   Diagnosis Date Noted   Mixed hyperlipidemia 06/21/2024   Hypokalemia 06/21/2024   Prediabetes 08/01/2022   Allergy to bee sting 07/31/2022   Postural dizziness with presyncope 07/31/2022   Meningitis, viral 09/10/2013      Review of Systems  All other systems reviewed and are negative.     Objective:     BP 130/72 (BP Location: Right Arm, Patient Position: Sitting, Cuff Size:  Normal)   Pulse 80   Temp 97.9 F (36.6 C) (Oral)   Ht 5' 3 (1.6 m)   Wt 128 lb 9.6 oz (58.3 kg)   SpO2 100%   BMI 22.78 kg/m    Physical Exam Vitals reviewed.  Constitutional:      Appearance: Normal appearance. She is well-groomed and normal weight.   Eyes:     Conjunctiva/sclera: Conjunctivae normal.   Neck:     Thyroid : No thyromegaly.   Cardiovascular:     Rate and Rhythm: Normal rate and regular rhythm.     Pulses: Normal pulses.     Heart sounds: S1 normal and S2 normal.  Pulmonary:     Effort: Pulmonary effort is normal.     Breath sounds: Normal breath sounds and air entry.  Abdominal:     General: Bowel sounds are normal.   Musculoskeletal:     Right lower leg: No edema.     Left lower leg: No edema.   Neurological:     Mental Status: She is alert and oriented to person, place, and time. Mental status is at baseline.     Gait: Gait is intact.   Psychiatric:        Mood and Affect: Mood and affect normal.        Speech: Speech normal.        Behavior: Behavior normal.        Judgment: Judgment  normal.      No results found for any visits on 06/21/24.    The ASCVD Risk score (Arnett DK, et al., 2019) failed to calculate for the following reasons:   Cannot find a previous HDL lab   Cannot find a previous total cholesterol lab    Assessment & Plan:  Tenosynovitis, de Quervain New diagnosis, will treat with a medrol  dose pak since she failed NSAIDS and I gave a handout on some rehab exercises. I believe she will need an injection in the tendon to relieve her symptoms, so I am sending her to ortho for this .   -     Ambulatory referral to Orthopedics -     methylPREDNISolone ; Take package as directed.  Dispense: 21 each; Refill: 0  Prediabetes Assessment & Plan: Needs new A1C today for surveillance, diet controlled only.   Orders: -     Hemoglobin A1c; Future  Mixed hyperlipidemia Assessment & Plan: On lipitor, tolerating it well, needs  new lipid panel for surveillance, I have refilled her medication today  Orders: -     Lipid panel; Future -     Atorvastatin  Calcium ; Take 1 tablet (20 mg total) by mouth at bedtime.  Dispense: 90 tablet; Refill: 1  Hypokalemia Assessment & Plan: Chronic, just recently had CMP in March which showed a normal potassium, will refill her medication today  Orders: -     Potassium Chloride  ER; TAKE 1 TABLET(20 MEQ) BY MOUTH DAILY  Dispense: 90 tablet; Refill: 1     Return in about 1 year (around 06/21/2025) for follow up.    Heron CHRISTELLA Sharper, MD

## 2024-07-25 ENCOUNTER — Other Ambulatory Visit: Payer: Self-pay | Admitting: Family Medicine

## 2024-07-25 DIAGNOSIS — E876 Hypokalemia: Secondary | ICD-10-CM

## 2024-07-25 NOTE — Telephone Encounter (Signed)
 I sent a 90 day supply on 7/1-- did they not get the refill?

## 2024-09-27 ENCOUNTER — Telehealth: Payer: Self-pay

## 2024-09-27 ENCOUNTER — Other Ambulatory Visit: Payer: Self-pay | Admitting: Family Medicine

## 2024-09-27 MED ORDER — DOXYCYCLINE HYCLATE 100 MG PO TABS: 100.0000 mg | ORAL_TABLET | Freq: Every day | ORAL | 0 refills | Status: AC

## 2024-09-27 NOTE — Telephone Encounter (Signed)
 Patient informed of the message below and stated she will be traveling to [place].  Forwarded to PCP.

## 2024-09-27 NOTE — Telephone Encounter (Signed)
 I do not see them anywhere in the historical meds and I do not see a request for them from last year-- where will she be traveling to?

## 2024-09-27 NOTE — Telephone Encounter (Signed)
 Copied from CRM [id number]. Topic: Clinical - Medication Question >> Sep 27, 2024 10:49 AM Mercedes MATSU wrote: Reason for CRM: Patient is requesting rx for Malaria pills because she is traveling. She said Dr. Ozell wrote the rx last year and she would like it sent to the pharmacy on file.

## 2024-09-27 NOTE — Telephone Encounter (Signed)
 Script sent

## 2024-09-28 NOTE — Telephone Encounter (Signed)
 Patient informed of the message below.

## 2024-10-05 ENCOUNTER — Telehealth: Payer: Self-pay | Admitting: *Deleted

## 2024-10-05 NOTE — Telephone Encounter (Signed)
 Copied from CRM [id number]. Topic: Clinical - Medication Question >> Oct 05, 2024  2:01 PM Ashley R wrote: Reason for CRM: pt has question about: doxycycline  (VIBRA -TABS) 100 MG tablet     Should she start taking before traveling to [place] of after arrival on November 3rd.  Callback [phone]

## 2024-10-10 NOTE — Telephone Encounter (Signed)
 Left a detailed message with the information below at the patient's cell number.

## 2024-10-10 NOTE — Telephone Encounter (Signed)
 She will need to start the medication 1-2 days prior to being in [place]
